# Patient Record
Sex: Male | Born: 1965 | Race: Black or African American | Hispanic: No | Smoking: Former smoker
Health system: Southern US, Community
[De-identification: ages and names within clinical notes are randomized; demographics above are authoritative.]

## PROBLEM LIST (undated history)

## (undated) DIAGNOSIS — F329 Major depressive disorder, single episode, unspecified: Secondary | ICD-10-CM

## (undated) DIAGNOSIS — K649 Unspecified hemorrhoids: Secondary | ICD-10-CM

## (undated) DIAGNOSIS — F419 Anxiety disorder, unspecified: Secondary | ICD-10-CM

## (undated) DIAGNOSIS — K5900 Constipation, unspecified: Secondary | ICD-10-CM

## (undated) DIAGNOSIS — F32A Depression, unspecified: Secondary | ICD-10-CM

## (undated) DIAGNOSIS — T7840XA Allergy, unspecified, initial encounter: Secondary | ICD-10-CM

## (undated) DIAGNOSIS — K602 Anal fissure, unspecified: Secondary | ICD-10-CM

## (undated) HISTORY — DX: Depression, unspecified: F32.A

## (undated) HISTORY — DX: Major depressive disorder, single episode, unspecified: F32.9

## (undated) HISTORY — DX: Unspecified hemorrhoids: K64.9

## (undated) HISTORY — DX: Anal fissure, unspecified: K60.2

## (undated) HISTORY — DX: Constipation, unspecified: K59.00

## (undated) HISTORY — PX: OTHER SURGICAL HISTORY: SHX169

## (undated) HISTORY — DX: Allergy, unspecified, initial encounter: T78.40XA

---

## 2007-12-22 ENCOUNTER — Emergency Department (HOSPITAL_COMMUNITY): Admission: EM | Admit: 2007-12-22 | Discharge: 2007-12-22 | Payer: Self-pay | Admitting: Emergency Medicine

## 2011-09-11 ENCOUNTER — Ambulatory Visit (INDEPENDENT_AMBULATORY_CARE_PROVIDER_SITE_OTHER): Payer: 59

## 2011-09-13 ENCOUNTER — Ambulatory Visit (INDEPENDENT_AMBULATORY_CARE_PROVIDER_SITE_OTHER): Payer: 59 | Admitting: Family Medicine

## 2011-09-13 VITALS — BP 94/64 | HR 65 | Temp 97.6°F | Resp 16 | Ht 67.0 in | Wt 139.0 lb

## 2011-09-13 DIAGNOSIS — F4010 Social phobia, unspecified: Secondary | ICD-10-CM

## 2011-09-13 DIAGNOSIS — F411 Generalized anxiety disorder: Secondary | ICD-10-CM

## 2011-09-13 DIAGNOSIS — F329 Major depressive disorder, single episode, unspecified: Secondary | ICD-10-CM

## 2011-09-13 MED ORDER — CITALOPRAM HYDROBROMIDE 10 MG PO TABS: 10.0000 mg | ORAL_TABLET | Freq: Every day | ORAL | Status: DC

## 2011-09-13 NOTE — Progress Notes (Signed)
  Subjective:    Patient ID: Tyrone Hendricks, male    DOB: 10/03/1961, 46 y.o.   MRN: [id number]  HPI Presents in follow up of depression.   Wellbutrin has helped with improved mood and energy. Complains today of significant social anxiety to the extent he avoids public settings. At times even interacting with co workers can be difficult.  Appetite ok Normal energy Exercising regularly  No SI/HI  Review of Systems     Objective:   Physical Exam  Constitutional: She appears well-developed and well-nourished.  Cardiovascular: Normal rate, regular rhythm and normal heart sounds.   Pulmonary/Chest: Effort normal and breath sounds normal.          Assessment & Plan:   1. Depression   2. Social anxiety disorder    Continue with Wellbutrin Add celexa 10mg  daily Psychotherapy strongly encouraged, names of therapist provided Follow up 1 month

## 2011-09-21 DIAGNOSIS — F329 Major depressive disorder, single episode, unspecified: Secondary | ICD-10-CM | POA: Insufficient documentation

## 2011-09-21 DIAGNOSIS — F32A Depression, unspecified: Secondary | ICD-10-CM | POA: Insufficient documentation

## 2011-10-21 ENCOUNTER — Other Ambulatory Visit: Payer: Self-pay | Admitting: Family Medicine

## 2011-11-09 ENCOUNTER — Other Ambulatory Visit: Payer: Self-pay | Admitting: Family Medicine

## 2011-11-20 ENCOUNTER — Ambulatory Visit (INDEPENDENT_AMBULATORY_CARE_PROVIDER_SITE_OTHER): Payer: 59 | Admitting: Family Medicine

## 2011-11-20 ENCOUNTER — Other Ambulatory Visit: Payer: Self-pay | Admitting: Physician Assistant

## 2011-11-20 VITALS — BP 115/71 | HR 67 | Temp 97.7°F | Resp 18 | Ht 67.0 in | Wt 134.0 lb

## 2011-11-20 DIAGNOSIS — F411 Generalized anxiety disorder: Secondary | ICD-10-CM

## 2011-11-20 DIAGNOSIS — F329 Major depressive disorder, single episode, unspecified: Secondary | ICD-10-CM

## 2011-11-20 MED ORDER — FLUTICASONE PROPIONATE 50 MCG/ACT NA SUSP: 2.0000 | Freq: Every day | NASAL | Status: DC

## 2011-11-20 MED ORDER — BUPROPION HCL ER (XL) 300 MG PO TB24: 300.0000 mg | ORAL_TABLET | Freq: Every day | ORAL | Status: DC

## 2011-11-20 NOTE — Progress Notes (Signed)
  Subjective:    Patient ID: Tyrone Hendricks, male    DOB: 12-19-1965, 46 y.o.   MRN: [id number]  HPI  Patient presents in follow up of anxiety  Compliant with medications without side effects.  Has been seeing psychologist which patient feels has been marginally helpful Feels more confidant with less avoidance behaviours  Significant seasonal allergies- requests refill of flonase  Review of Systems     Objective:   Physical Exam  Constitutional: He appears well-developed.  HENT:  Nose: Mucosal edema present.  Neck: Neck supple.  Cardiovascular: Normal rate, regular rhythm and normal heart sounds.   Pulmonary/Chest: Effort normal and breath sounds normal.  Neurological: He is alert.  Skin: Skin is warm.          Assessment & Plan:   1. Depression  buPROPion (WELLBUTRIN XL) 300 MG 24 hr tablet  2. Allergic rhinitis  fluticasone (FLONASE) 50 MCG/ACT nasal spray   20 minutes of supportive counseling Patient expressed interest in weaning however do not feel it is appropriate at this time.   Patient to RTC in 3 months and if patient continues to do well will reduce dose by 50%

## 2011-11-23 ENCOUNTER — Other Ambulatory Visit: Payer: Self-pay | Admitting: Physician Assistant

## 2012-07-03 ENCOUNTER — Emergency Department (INDEPENDENT_AMBULATORY_CARE_PROVIDER_SITE_OTHER): Payer: Worker's Compensation

## 2012-07-03 ENCOUNTER — Emergency Department (INDEPENDENT_AMBULATORY_CARE_PROVIDER_SITE_OTHER)
Admission: EM | Admit: 2012-07-03 | Discharge: 2012-07-03 | Disposition: A | Discharge status: Home / Self Care | Payer: Worker's Compensation | Source: Home / Self Care | Attending: Family Medicine | Admitting: Family Medicine

## 2012-07-03 ENCOUNTER — Encounter (HOSPITAL_COMMUNITY): Payer: Self-pay | Admitting: Emergency Medicine

## 2012-07-03 DIAGNOSIS — IMO0002 Reserved for concepts with insufficient information to code with codable children: Secondary | ICD-10-CM

## 2012-07-03 HISTORY — DX: Anxiety disorder, unspecified: F41.9

## 2012-07-03 MED ORDER — TRAMADOL HCL 50 MG PO TABS: 50.0000 mg | ORAL_TABLET | Freq: Four times a day (QID) | ORAL | Status: DC | PRN

## 2012-07-03 MED ORDER — IBUPROFEN 600 MG PO TABS: 600.0000 mg | ORAL_TABLET | Freq: Three times a day (TID) | ORAL | Status: DC | PRN

## 2012-07-03 NOTE — ED Notes (Signed)
Left hand pain, injured at work per patient.  Injury occurred one month ago.  Caught in machine.  Left little finger is swollen, deformity present, finger will not straighten

## 2012-07-03 NOTE — ED Provider Notes (Signed)
History     CSN: [id number]  Arrival date & time 07/03/12  4098   First MD Initiated Contact with Patient 07/03/12 1754      Chief Complaint  Patient presents with  . Hand Pain    (Consider location/radiation/quality/duration/timing/severity/associated sxs/prior treatment) HPI Comments: 46 year old male with history of mood disorder. Works in a Adult nurse. Here complaining of left hand fifth finger pain, swelling and deformity for one month. Patient reports that he had an injury where his left small finger got caught by a machine belt and he pulled his hand about a month ago. He had swelling and pain after his injury that is not resolving, cannot strengthen his finger completely. 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 Denies any other symptoms.    Past Medical History  Diagnosis Date  . Anxiety     History reviewed. No pertinent past surgical history.  No family history on file.  History  Substance Use Topics  . Smoking status: Former Smoker    Quit date: 11/20/2006  . Smokeless tobacco: Not on file  . Alcohol Use: No      Review of Systems  Musculoskeletal:       As per HPI  Skin: Negative for wound.  All other systems reviewed and are negative.    Allergies  Review of patient's allergies indicates no known allergies.  Home Medications   Current Outpatient Rx  Name  Route  Sig  Dispense  Refill  . BUPROPION HCL ER (XL) 300 MG PO TB24   Oral   Take 300 mg by mouth daily.         . BUPROPION HCL ER (XL) 300 MG PO TB24   Oral   Take 1 tablet (300 mg total) by mouth daily.   90 tablet   2     Needs office visit for more   . BUPROPION HCL ER (XL) 300 MG PO TB24      TAKE 1 TABLET BY MOUTH DAILY   30 tablet   0   . CITALOPRAM HYDROBROMIDE 10 MG PO TABS   Oral   Take 1 tablet (10 mg total) by mouth daily.   30 tablet   1   . FLUTICASONE PROPIONATE 50  MCG/ACT NA SUSP   Nasal   Place 2 sprays into the nose daily.   16 g   11   . IBUPROFEN 600 MG PO TABS   Oral   Take 1 tablet (600 mg total) by mouth every 8 (eight) hours as needed for pain.   20 tablet   0   . BACID PO TABS   Oral   Take 2 tablets by mouth daily.         . TRAMADOL HCL 50 MG PO TABS   Oral   Take 1 tablet (50 mg total) by mouth every 6 (six) hours as needed for pain.   15 tablet   0     BP 113/80  Pulse 64  Temp 97.7 F (36.5 C) (Oral)  Resp 18  SpO2 98%  Physical Exam  Nursing note and vitals reviewed. Constitutional: He is oriented to person, place, and time. He appears well-developed and well-nourished. No distress.  HENT:  Head: Normocephalic and atraumatic.  Cardiovascular: Normal heart sounds.   Pulmonary/Chest: Breath sounds normal.  Musculoskeletal:       Left 5th finger: swelling and deformity at  the expense of middle phalange. No wounds or scars. Focal tenderness at mid phalange both dorsally and on palmar side. Patient able to partially extend finger. Can not extend to straight position the distal phalange. Intact sensation. Brisk cap refill at digital pad and entire 5th digit. Rest of let hand exam is normal.   Neurological: He is alert and oriented to person, place, and time.    ED Course  Procedures (including critical care time)  Labs Reviewed - No data to display Dg Finger Little Left  07/03/2012  *RADIOLOGY REPORT*  Clinical Data: Pain, swelling  LEFT LITTLE FINGER 2+V  Comparison: None.  Findings: There is a transverse minimally comminuted fracture of the middle phalanx left little finger across the proximal base, without definite extension to the subchondral cortex.  There is 1-2 mm palmar displacement of the distal fracture fragment with some mild dorsal angulation.  There is some early callus formation at the dorsal aspect of the fracture.  IMPRESSION:  1.  Healing transverse fracture, base middle phalanx left little finger,  with mild displacement and angulation.   Original Report Authenticated By: D. Andria Rhein, MD      1. Closed fracture of middle or proximal phalanx or phalanges of hand       MDM  Complex angulated fracture at the base of the mid phalanx of the left fifth finger. Possible involvement of extensor tendon as well. One month evolution. Placed on a finger splint. Prescribed ibuprofen and tramadol. Asked to followup with the hand specialist. Referral provided. Asked to followup at the works man comp clinic designated by his employer.  Sharin Grave, MD 07/05/12 0945

## 2012-07-03 NOTE — ED Notes (Signed)
Tyrone Hendricks called [facility].  Clarified the intent of visit is to address finger complaint.  No labs to be done since injury one month out.

## 2012-07-03 NOTE — ED Notes (Signed)
Went to [facility] today at 4:30, told they were closing and sent to [facility].

## 2012-07-13 ENCOUNTER — Ambulatory Visit (INDEPENDENT_AMBULATORY_CARE_PROVIDER_SITE_OTHER): Payer: 59 | Admitting: Physician Assistant

## 2012-07-13 VITALS — BP 98/66 | HR 64 | Temp 97.7°F | Resp 16 | Ht 67.5 in | Wt 148.2 lb

## 2012-07-13 DIAGNOSIS — K649 Unspecified hemorrhoids: Secondary | ICD-10-CM

## 2012-07-13 DIAGNOSIS — K5909 Other constipation: Secondary | ICD-10-CM | POA: Insufficient documentation

## 2012-07-13 DIAGNOSIS — K5900 Constipation, unspecified: Secondary | ICD-10-CM

## 2012-07-13 DIAGNOSIS — F329 Major depressive disorder, single episode, unspecified: Secondary | ICD-10-CM

## 2012-07-13 MED ORDER — POLYETHYLENE GLYCOL 3350 17 GM/SCOOP PO POWD: 17.0000 g | Freq: Every day | ORAL | Status: DC

## 2012-07-13 MED ORDER — BUPROPION HCL ER (XL) 150 MG PO TB24: 300.0000 mg | ORAL_TABLET | Freq: Every day | ORAL | Status: DC

## 2012-07-13 MED ORDER — HYDROCORTISONE 2.5 % RE CREA: TOPICAL_CREAM | Freq: Two times a day (BID) | RECTAL | Status: DC

## 2012-07-13 NOTE — Patient Instructions (Addendum)
Take miralax to help with constipation.  Increase fluid intake and start a fiber supplement. COntinue good amounts of fruits and veggies. Will send to GI for eval of chronic constipation.  Will decrease wellbutrin for depression - pt to recheck in 1 month to see how he is doing.

## 2012-07-13 NOTE — Progress Notes (Signed)
   [address] [place] [address]   Phone [phone]  Subjective:    Patient ID: Tyrone Hendricks, male    DOB: [birth year], 46 y.o.   MRN: [id number]  HPI Pt presents to clinic with concerns regarding his depression and constipation.  He had a significant problem with constipation in 2010 and was put on medication for depression that resulted from that.  He has wanted to go off the medication for a while.  Early this year he felt much better but at the time he was still having social anxiety so they kept him on the medication.  Now his anxiety is better and he feels no depression and would like to go off the medication.  He is still having constipation with hard and large stools that are painful to pass.  He has tried to increase his fruits and veggies but it does not seem to make a difference.  He as resulting hemorrhoids that he used to have cream for but he has run out and has flairs that are really uncomfortable.  He has blood a lot on toilet tissue and sometimes into the water.    Review of Systems  Gastrointestinal: Positive for constipation and blood in stool. Negative for diarrhea.  Psychiatric/Behavioral: Negative for suicidal ideas, sleep disturbance, self-injury and dysphoric mood. The patient is nervous/anxious (much better).        Objective:   Physical Exam  Vitals reviewed. Constitutional: He is oriented to person, place, and time. He appears well-developed and well-nourished.  HENT:  Head: Normocephalic and atraumatic.  Right Ear: External ear normal.  Left Ear: External ear normal.  Cardiovascular: Normal rate, regular rhythm and normal heart sounds.   No murmur heard. Pulmonary/Chest: Effort normal and breath sounds normal.  Abdominal: Soft.  Genitourinary: Rectal exam shows external hemorrhoid. Rectal exam shows no fissure, no mass and no tenderness. Guaiac negative stool.  Neurological: He is alert and oriented to person, place, and time.  Skin: Skin is warm and dry.   Psychiatric: His behavior is normal. Judgment and thought content normal. His affect is blunt.          Assessment & Plan:   1. Constipation, chronic  TSH, IFOBT POC (occult bld, rslt in office), polyethylene glycol powder (GLYCOLAX/MIRALAX) powder, Ambulatory referral to Gastroenterology  2. Hemorrhoids  hydrocortisone (ANUSOL-HC) 2.5 % rectal cream  3. Depression  buPROPion (WELLBUTRIN XL) 150 MG 24 hr tablet   1- concerning that before 2010 he had no problems with constipation and now he has chronic constipation.  I think a referral is indicated to GI. He is increase water, fiber with fruits and veggies.  Will check to make sure patient thyroid is functioning normal. 2- pt to try and control constipation to decrease problems with hemorrhoids. 3- I am concerned that pt might still have some depression but he really wants to get off these meds so we will decrease wellbutrin and recheck in 1 month.  Pt understands and agrees with the above.

## 2012-07-14 ENCOUNTER — Encounter: Payer: Self-pay | Admitting: Gastroenterology

## 2012-07-17 ENCOUNTER — Telehealth: Payer: Self-pay

## 2012-07-17 NOTE — Telephone Encounter (Signed)
Sarah, in OV notes it looks like you wanted pt to decrease his Wellbutrin, but Rx is written for 150 mg 2 tablets QD. Did you want him to taper down?

## 2012-07-17 NOTE — Telephone Encounter (Signed)
Tyrone Hendricks,    Patient wants to know if he is to reduce the medication Wellbutrin.  He is currently taking 300 mg a day.  [phone]

## 2012-07-18 NOTE — Telephone Encounter (Signed)
Yes he should take 1 a day but I wanted to have the ability for him to take 2 if he needed to.

## 2012-07-19 NOTE — Telephone Encounter (Signed)
LMOM to Cb to discuss.

## 2012-07-21 NOTE — Telephone Encounter (Signed)
lmom to cb. 

## 2012-07-22 NOTE — Telephone Encounter (Signed)
Pt CB and I gave him instr's from Maralyn Sago. Pt thought this was what he was supposed to do, but just wasn't completely sure. Pt ageed.

## 2012-07-26 ENCOUNTER — Other Ambulatory Visit: Payer: Self-pay | Admitting: Physician Assistant

## 2012-07-30 ENCOUNTER — Ambulatory Visit: Payer: Self-pay | Admitting: Gastroenterology

## 2012-08-04 ENCOUNTER — Encounter: Payer: Self-pay | Admitting: *Deleted

## 2012-08-13 ENCOUNTER — Ambulatory Visit: Payer: Self-pay | Admitting: Gastroenterology

## 2012-09-01 ENCOUNTER — Ambulatory Visit: Payer: Self-pay | Admitting: Gastroenterology

## 2012-09-03 ENCOUNTER — Ambulatory Visit (INDEPENDENT_AMBULATORY_CARE_PROVIDER_SITE_OTHER): Payer: PRIVATE HEALTH INSURANCE | Admitting: Gastroenterology

## 2012-09-03 ENCOUNTER — Encounter: Payer: Self-pay | Admitting: Gastroenterology

## 2012-09-03 VITALS — BP 98/58 | HR 60 | Ht 67.5 in | Wt 146.8 lb

## 2012-09-03 DIAGNOSIS — K5909 Other constipation: Secondary | ICD-10-CM

## 2012-09-03 DIAGNOSIS — K625 Hemorrhage of anus and rectum: Secondary | ICD-10-CM

## 2012-09-03 DIAGNOSIS — K602 Anal fissure, unspecified: Secondary | ICD-10-CM

## 2012-09-03 DIAGNOSIS — K5900 Constipation, unspecified: Secondary | ICD-10-CM

## 2012-09-03 MED ORDER — MOVIPREP 100 G PO SOLR: 1.0000 | Freq: Once | ORAL | Status: DC

## 2012-09-03 MED ORDER — NITROGLYCERIN 0.4 % RE OINT: 1.0000 "application " | TOPICAL_OINTMENT | Freq: Two times a day (BID) | RECTAL | Status: DC

## 2012-09-03 NOTE — Patient Instructions (Signed)
You have been scheduled for a colonoscopy with propofol. Please follow written instructions given to you at your visit today.  Please pick up your prep kit at the pharmacy within the next 1-3 days. If you use inhalers (even only as needed) or a CPAP machine, please bring them with you on the day of your procedure.  We have sent the following medications to your pharmacy for you to pick up at your convenience: Rectiv. Please apply to rectal area twice daily.  Please purchase Metamucil over the counter. Take as directed.  Information on High Fiber Diet and Anal Fissures is listed below.  ____________________________________________________________________________________________________________________  High-Fiber Diet Fiber is found in fruits, vegetables, and grains. A high-fiber diet encourages the addition of more whole grains, legumes, fruits, and vegetables in your diet. The recommended amount of fiber for adult males is 38 g per day. For adult females, it is 25 g per day. Pregnant and lactating women should get 28 g of fiber per day. If you have a digestive or bowel problem, ask your caregiver for advice before adding high-fiber foods to your diet. Eat a variety of high-fiber foods instead of only a select few type of foods.  PURPOSE  To increase stool bulk.  To make bowel movements more regular to prevent constipation.  To lower cholesterol.  To prevent overeating. WHEN IS THIS DIET USED?  It may be used if you have constipation and hemorrhoids.  It may be used if you have uncomplicated diverticulosis (intestine condition) and irritable bowel syndrome.  It may be used if you need help with weight management.  It may be used if you want to add it to your diet as a protective measure against atherosclerosis, diabetes, and cancer. SOURCES OF FIBER  Whole-grain breads and cereals.  Fruits, such as apples, oranges, bananas, berries, prunes, and pears.  Vegetables, such as green  peas, carrots, sweet potatoes, beets, broccoli, cabbage, spinach, and artichokes.  Legumes, such split peas, soy, lentils.  Almonds. FIBER CONTENT IN FOODS Starches and Grains / Dietary Fiber (g)  Cheerios, 1 cup / 3 g  Corn Flakes cereal, 1 cup / 0.7 g  Rice crispy treat cereal, 1 cup / 0.3 g  Instant oatmeal (cooked),  cup / 2 g  Frosted wheat cereal, 1 cup / 5.1 g  Brown, long-grain rice (cooked), 1 cup / 3.5 g  White, long-grain rice (cooked), 1 cup / 0.6 g  Enriched macaroni (cooked), 1 cup / 2.5 g Legumes / Dietary Fiber (g)  Baked beans (canned, plain, or vegetarian),  cup / 5.2 g  Kidney beans (canned),  cup / 6.8 g  Pinto beans (cooked),  cup / 5.5 g Breads and Crackers / Dietary Fiber (g)  Plain or honey graham crackers, 2 squares / 0.7 g  Saltine crackers, 3 squares / 0.3 g  Plain, salted pretzels, 10 pieces / 1.8 g  Whole-wheat bread, 1 slice / 1.9 g  White bread, 1 slice / 0.7 g  Raisin bread, 1 slice / 1.2 g  Plain bagel, 3 oz / 2 g  Flour tortilla, 1 oz / 0.9 g  Corn tortilla, 1 small / 1.5 g  Hamburger or hotdog bun, 1 small / 0.9 g Fruits / Dietary Fiber (g)  Apple with skin, 1 medium / 4.4 g  Sweetened applesauce,  cup / 1.5 g  Banana,  medium / 1.5 g  Grapes, 10 grapes / 0.4 g  Orange, 1 small / 2.3 g  Raisin, 1.5 oz /  1.6 g  Melon, 1 cup / 1.4 g Vegetables / Dietary Fiber (g)  Green beans (canned),  cup / 1.3 g  Carrots (cooked),  cup / 2.3 g  Broccoli (cooked),  cup / 2.8 g  Peas (cooked),  cup / 4.4 g  Mashed potatoes,  cup / 1.6 g  Lettuce, 1 cup / 0.5 g  Corn (canned),  cup / 1.6 g  Tomato,  cup / 1.1 g Document Released: 07/29/2005 Document Revised: 01/28/2012 Document Reviewed: 10/31/2011 [employer] Patient Information 2013 [employer], [place]. _________________________________________________________________________________________________________________  Tyrone Hendricks, Adult An anal fissure is  a small tear or crack in the skin around the anus. Bleeding from a fissure usually stops on its own within a few minutes. However, bleeding will often reoccur with each bowel movement until the crack heals.  CAUSES   Passing large, hard stools.  Frequent diarrheal stools.  Constipation.  Inflammatory bowel disease (Crohn's disease or ulcerative colitis).  Infections.  Anal sex. SYMPTOMS   Small amounts of blood seen on your stools, on toilet paper, or in the toilet after a bowel movement.  Rectal bleeding.  Painful bowel movements.  Itching or irritation around the anus. DIAGNOSIS Your caregiver will examine the anal area. An anal fissure can usually be seen with careful inspection. A rectal exam may be performed and a short tube (anoscope) may be used to examine the anal canal. TREATMENT   You may be instructed to take fiber supplements. These supplements can soften your stool to help make bowel movements easier.  Sitz baths may be recommended to help heal the tear. Do not use soap in the sitz baths.  A medicated cream or ointment may be prescribed to lessen discomfort. HOME CARE INSTRUCTIONS   Maintain a diet high in fruits, whole grains, and vegetables. Avoid constipating foods like bananas and dairy products.  Take sitz baths as directed by your caregiver.  Drink enough fluids to keep your urine clear or pale yellow.  Only take over-the-counter or prescription medicines for pain, discomfort, or fever as directed by your caregiver. Do not take aspirin as this may increase bleeding.  Do not use ointments containing numbing medications (anesthetics) or hydrocortisone. They could slow healing. SEEK MEDICAL CARE IF:   Your fissure is not completely healed within 3 days.  You have further bleeding.  You have a fever.  You have diarrhea mixed with blood.  You have pain.  Your problem is getting worse rather than better. MAKE SURE YOU:   Understand these  instructions.  Will watch your condition.  Will get help right away if you are not doing well or get worse. Document Released: 07/29/2005 Document Revised: 10/21/2011 Document Reviewed: 01/13/2011 [facility] Patient Information 2013 [place], [place].

## 2012-09-03 NOTE — Progress Notes (Signed)
History of Present Illness:  This is a 47 year old African male who is been in the [place] since 2001.  He has a long history of chronic functional constipation, has had bright red blood per rectum and rectal pain on and off since 2009.  He has not had previous barium studies or colon exams.  He continues with rectal bleeding and rectal pain despite salves supplied by is primary care physician.  He denies a specific food intolerances, upper GI or hepatobiliary complaints.  Family history is incomplete.  I have reviewed this patient's present history, medical and surgical past history, allergies and medications.     ROS:   All systems were reviewed and are negative unless otherwise stated in the HPI.    Physical Exam: Blood pressure 90/58, pulse 60 and regular, and weight 146 pounds the BMI 22.65. General well developed well nourished patient in no acute distress, appearing their stated age Eyes PERRLA, no icterus, fundoscopic exam per opthamologist Skin no lesions noted Neck supple, no adenopathy, no thyroid enlargement, no tenderness Chest clear to percussion and auscultation Heart no significant murmurs, gallops or rubs noted Abdomen no hepatosplenomegaly masses or tenderness, BS normal.  Rectal inspection normal no fissures, or fistulae noted.  No masses or tenderness on digital exam. Stool guaiac negative. Extremities no acute joint lesions, edema, phlebitis or evidence of cellulitis. Neurologic patient oriented x 3, cranial nerves intact, no focal neurologic deficits noted. Psychological mental status normal and normal affect.  ANOSCOPY: Medium-sized posterior skin tag noted.  Rectal exam shows no masses or tenderness with hard stool in the rectal vault which is guaiac negative.  Anoscopic exam shows a prominent posterior chronic fissure with some minor bleeding.  There is no evidence of significant hemorrhoids, or fistulae.  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  I have placed him on a high fiber diet with daily Metamucil, liberal by mouth fluids, and nitroglycerin gel twice a day to his periana/rectal area.  We'll perform colonoscopy one month's time.  Information given concerning fissures, constipation, and their management.  No diagnosis found.

## 2012-09-14 ENCOUNTER — Encounter: Payer: Self-pay | Admitting: Gastroenterology

## 2012-09-16 ENCOUNTER — Ambulatory Visit (AMBULATORY_SURGERY_CENTER): Payer: PRIVATE HEALTH INSURANCE | Admitting: Gastroenterology

## 2012-09-16 ENCOUNTER — Encounter: Payer: Self-pay | Admitting: Gastroenterology

## 2012-09-16 VITALS — BP 103/73 | HR 55 | Temp 96.5°F | Resp 15 | Ht 67.0 in | Wt 146.0 lb

## 2012-09-16 DIAGNOSIS — K644 Residual hemorrhoidal skin tags: Secondary | ICD-10-CM

## 2012-09-16 DIAGNOSIS — K5909 Other constipation: Secondary | ICD-10-CM

## 2012-09-16 DIAGNOSIS — K5900 Constipation, unspecified: Secondary | ICD-10-CM

## 2012-09-16 DIAGNOSIS — Q433 Congenital malformations of intestinal fixation: Secondary | ICD-10-CM

## 2012-09-16 DIAGNOSIS — Z1211 Encounter for screening for malignant neoplasm of colon: Secondary | ICD-10-CM

## 2012-09-16 DIAGNOSIS — K602 Anal fissure, unspecified: Secondary | ICD-10-CM

## 2012-09-16 DIAGNOSIS — K625 Hemorrhage of anus and rectum: Secondary | ICD-10-CM

## 2012-09-16 MED ORDER — SODIUM CHLORIDE 0.9 % IV SOLN: 500.0000 mL | INTRAVENOUS | Status: DC

## 2012-09-16 NOTE — Patient Instructions (Addendum)
YOU HAD AN ENDOSCOPIC PROCEDURE TODAY AT THE [facility]: Refer to the procedure report that was given to you for any specific questions about what was found during the examination.  If the procedure report does not answer your questions, please call your gastroenterologist to clarify.  If you requested that your care partner not be given the details of your procedure findings, then the procedure report has been included in a sealed envelope for you to review at your convenience later.  YOU SHOULD EXPECT: Some feelings of bloating in the abdomen. Passage of more gas than usual.  Walking can help get rid of the air that was put into your GI tract during the procedure and reduce the bloating. If you had a lower endoscopy (such as a colonoscopy or flexible sigmoidoscopy) you may notice spotting of blood in your stool or on the toilet paper. If you underwent a bowel prep for your procedure, then you may not have a normal bowel movement for a few days.  DIET: Your first meal following the procedure should be a light meal and then it is ok to progress to your normal diet.  A half-sandwich or bowl of soup is an example of a good first meal.  Heavy or fried foods are harder to digest and may make you feel nauseous or bloated.  Likewise meals heavy in dairy and vegetables can cause extra gas to form and this can also increase the bloating.  Drink plenty of fluids but you should avoid alcoholic beverages for 24 hours.  ACTIVITY: Your care partner should take you home directly after the procedure.  You should plan to take it easy, moving slowly for the rest of the day.  You can resume normal activity the day after the procedure however you should NOT DRIVE or use heavy machinery for 24 hours (because of the sedation medicines used during the test).    SYMPTOMS TO REPORT IMMEDIATELY: A gastroenterologist can be reached at any hour.  During normal business hours, 8:30 AM to 5:00 PM Monday through Friday,  call [phone].  After hours and on weekends, please call the GI answering service at [phone] who will take a message and have the physician on call contact you.   Following lower endoscopy (colonoscopy or flexible sigmoidoscopy):  Excessive amounts of blood in the stool  Significant tenderness or worsening of abdominal pains  Swelling of the abdomen that is new, acute  Fever of 100F or higher    FOLLOW UP: If any biopsies were taken you will be contacted by phone or by letter within the next 1-3 weeks.  Call your gastroenterologist if you have not heard about the biopsies in 3 weeks.  Our staff will call the home number listed on your records the next business day following your procedure to check on you and address any questions or concerns that you may have at that time regarding the information given to you following your procedure. This is a courtesy call and so if there is no answer at the home number and we have not heard from you through the emergency physician on call, we will assume that you have returned to your regular daily activities without incident.  SIGNATURES/CONFIDENTIALITY: You and/or your care partner have signed paperwork which will be entered into your electronic medical record.  These signatures attest to the fact that that the information above on your After Visit Summary has been reviewed and is understood.  Full responsibility of the confidentiality   of this discharge information lies with you and/or your care-partner.     

## 2012-09-16 NOTE — Op Note (Signed)
[facility] [address] [place], [address]   COLONOSCOPY PROCEDURE REPORT  PATIENT: Tyrone Hendricks, Tyrone Hendricks  MR#: [id number] BIRTHDATE: 05/02/1966 , 46  yrs. old GENDER: Male ENDOSCOPIST: Mardella Layman, MD, [facility] REFERRED BY: PROCEDURE DATE:  09/16/2012 PROCEDURE:   Colonoscopy, diagnostic ASA CLASS:   Class I INDICATIONS:Rectal Bleeding and Average risk patient for colon cancer. MEDICATIONS: propofol (Diprivan) 400mg  IV  DESCRIPTION OF PROCEDURE:   After the risks and benefits and of the procedure were explained, informed consent was obtained.  A digital rectal exam revealed external hemorrhoids.    The     endoscope was introduced through the anus and advanced to the cecum, which was identified by both the appendix and ileocecal valve .  The quality of the prep was good, using MoviPrep .  The instrument was then slowly withdrawn as the colon was fully examined.     COLON FINDINGS: The colon was redundant.  Manual abdominal counter-pressure was used to reach the cecum.   A normal appearing cecum, ileocecal valve, and appendiceal orifice were identified. The ascending, hepatic flexure, transverse, splenic flexure, descending, sigmoid colon and rectum appeared unremarkable.  No polyps or cancers were seen. 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  This is extremely difficult and long exam because of the tortuosity and redundancy and probable anatomical abnormality of his colon.    Retroflexed views revealed no abnormalities.     The scope was then withdrawn from the patient and the procedure completed.  COMPLICATIONS: There were no complications. ENDOSCOPIC IMPRESSION: 1.   The colon was redundant .Marland KitchenMarland Kitchenprobable malrotation of colon... 2.   Normal colon...external hemorrhoids,fissure appears healed  RECOMMENDATIONS: Continue current medications..may need barium enema for better anatomical clarification,contine meds for  constipation !!!!   REPEAT EXAM: .  repeat exam every 10 years...  cc:  _______________________________ eSigned:  Mardella Layman, MD, [facility] 09/16/2012 2:59 PM     PATIENT NAME:  Tyrone Hendricks, Tyrone Hendricks MR#: [id number]

## 2012-09-16 NOTE — Progress Notes (Signed)
Patient did not experience any of the following events: a burn prior to discharge; a fall within the facility; wrong site/side/patient/procedure/implant event; or a hospital transfer or hospital admission upon discharge from the facility. (G8907) Patient did not have preoperative order for IV antibiotic SSI prophylaxis. (G8918)  

## 2012-09-17 ENCOUNTER — Telehealth: Payer: Self-pay

## 2012-09-17 NOTE — Telephone Encounter (Signed)
Left message on number given in admitting for call-back purposes.

## 2012-09-26 ENCOUNTER — Other Ambulatory Visit: Payer: Self-pay

## 2012-11-23 ENCOUNTER — Telehealth: Payer: Self-pay | Admitting: Radiology

## 2012-11-23 MED ORDER — MOMETASONE FUROATE 50 MCG/ACT NA SUSP: 2.0000 | Freq: Every day | NASAL | Status: DC

## 2012-11-23 NOTE — Telephone Encounter (Signed)
Patient wants refill on Nasonex. Please advise

## 2012-11-23 NOTE — Telephone Encounter (Signed)
Rx sent to pharmacy   

## 2012-12-11 ENCOUNTER — Telehealth: Payer: Self-pay

## 2012-12-11 ENCOUNTER — Ambulatory Visit (INDEPENDENT_AMBULATORY_CARE_PROVIDER_SITE_OTHER): Payer: No Typology Code available for payment source | Admitting: Emergency Medicine

## 2012-12-11 VITALS — BP 103/66 | HR 60 | Temp 97.6°F | Resp 16 | Ht 69.0 in | Wt 151.4 lb

## 2012-12-11 DIAGNOSIS — J309 Allergic rhinitis, unspecified: Secondary | ICD-10-CM

## 2012-12-11 MED ORDER — ALBUTEROL SULFATE HFA 108 (90 BASE) MCG/ACT IN AERS: 2.0000 | INHALATION_SPRAY | RESPIRATORY_TRACT | Status: DC | PRN

## 2012-12-11 MED ORDER — FLUTICASONE PROPIONATE 50 MCG/ACT NA SUSP: 2.0000 | Freq: Every day | NASAL | Status: DC

## 2012-12-11 NOTE — Telephone Encounter (Signed)
Pt states his nasonex doesn't work. He says its affects his breathing. Asks for something else.    Pharmacy: [facility] [address]

## 2012-12-11 NOTE — Telephone Encounter (Signed)
If he is having trouble with his breathing, he should come in for this.

## 2012-12-11 NOTE — Patient Instructions (Addendum)
Allergic Rhinitis Allergic rhinitis is when the mucous membranes in the nose respond to allergens. Allergens are particles in the air that cause your body to have an allergic reaction. This causes you to release allergic antibodies. Through a chain of events, these eventually cause you to release histamine into the blood stream (hence the use of antihistamines). Although meant to be protective to the body, it is this release that causes your discomfort, such as frequent sneezing, congestion and an itchy runny nose.  CAUSES  The pollen allergens may come from grasses, trees, and weeds. This is seasonal allergic rhinitis, or "hay fever." Other allergens cause year-round allergic rhinitis (perennial allergic rhinitis) such as house dust mite allergen, pet dander and mold spores.  SYMPTOMS   Nasal stuffiness (congestion).  Runny, itchy nose with sneezing and tearing of the eyes.  There is often an itching of the mouth, eyes and ears. It cannot be cured, but it can be controlled with medications. DIAGNOSIS  If you are unable to determine the offending allergen, skin or blood testing may find it. TREATMENT   Avoid the allergen.  Medications and allergy shots (immunotherapy) can help.  Hay fever may often be treated with antihistamines in pill or nasal spray forms. Antihistamines block the effects of histamine. There are over-the-counter medicines that may help with nasal congestion and swelling around the eyes. Check with your caregiver before taking or giving this medicine. If the treatment above does not work, there are many new medications your caregiver can prescribe. Stronger medications may be used if initial measures are ineffective. Desensitizing injections can be used if medications and avoidance fails. Desensitization is when a patient is given ongoing shots until the body becomes less sensitive to the allergen. Make sure you follow up with your caregiver if problems continue. SEEK MEDICAL  CARE IF:   You develop fever (more than 100.5 F (38.1 C).  You develop a cough that does not stop easily (persistent).  You have shortness of breath.  You start wheezing.  Symptoms interfere with normal daily activities. Document Released: 04/23/2001 Document Revised: 10/21/2011 Document Reviewed: 11/02/2008 [facility] Patient Information 2013 [place], [place]. Metered Dose Inhaler (No Spacer Used) Inhaled medicines are the basis of asthma treatment and other breathing problems. Inhaled medicine can only be effective if used properly. Good technique assures that the medicine reaches the lungs. Metered dose inhalers (MDIs) are used to deliver a variety of inhaled medicines. These include quick relief medicines, controller medicines (such as corticosteroids), and cromolyn. The medicine is delivered by pushing down on a metal canister to release a set amount of spray.  If you are using different kinds of inhalers, use your quick relief medicine to open the airways 10 to 15 minutes before using a steroid. If you are unsure which inhalers to use and the order of using them, ask your caregiver, nurse, or respiratory therapist. HOW TO USE THE INHALER 1. Remove cap from inhaler. 2. Shake inhaler for 5 seconds before each inhalation (breathing in). 3. Position the inhaler so that the top of the canister faces up. 4. Put your index finger on the top of the medication canister. Your thumb supports the bottom of the inhaler. 5. Open your mouth. 6. Hold the inhaler 1 to 2 inches away from your open mouth. This allows the medicine to slow down before the medicine enters the mouth. 7. Exhale (breathe out) normally and as completely as possible. 8. Press the canister down with the index finger to release the  medication. 9. At the same time as the canister is pressed, inhale deeply and slowly until the lungs are completely filled. This should take 4 to 6 seconds. Keep your tongue down. 10. Hold the  medication in your lungs for up to 10 seconds (10 seconds is best). This helps the medicine get into the small airways of your lungs to work better. 11. Breathe out slowly, through pursed lips. Whistling is an example of pursed lips. 12. Wait at least 1 minute between puffs. Continue with the above steps until you have taken the number of puffs your caregiver has ordered. 13. Replace cap on inhaler. AVOID:  Inhaling before or after starting the spray of medicine. It takes practice to coordinate your breathing with triggering the spray.  Inhaling through the nose (rather than the mouth) when triggering the spray. HOW TO DETERMINE IF YOUR INHALER IS FULL OR NEARLY EMPTY:  Determine when an inhaler is empty. You cannot know when an MDI canister is empty by shaking it. A few MDIs are now being made with dose counters. Ask your caregiver for a prescription that has a dose counter if you feel you need that extra help.  If your inhaler does not have a counter, check the number of doses in the inhaler before you use it. The canister or box will list the number of doses in the canister. Divide the total number of doses in the canister by the number you will use each day to find how many days the canister will last. (For example, if your canister has 200 doses and you take 2 puffs, 4 times each day, which is 8 puffs a day. Dividing 200 by 8 equals 25. The canister should last 25 days.) Using a calendar, count forward that many days to see when your inhaler will run out. Write the refill date on a calendar or your canister.  Remember, if you need to take extra doses, the inhaler will empty sooner than you figured. Be sure you have a refill before your canister runs out. Refill your inhaler 7 to 10 days before it runs out. HOME CARE INSTRUCTIONS   Do not use the inhaler more than your caregiver tells you. If you are still wheezing and are feeling tightness in your chest, call your caregiver.  Keep an  adequate supply of medication. This includes making sure the medicine is not expired, and you have a spare MDI.  Follow your caregiver or inhaler insert directions for cleaning the inhaler. SEEK MEDICAL CARE IF:   Symptoms are only partially relieved with your inhalers.  You are having trouble using your inhalers.  You experience some increase in phlegm.  You develop a fever of 102 F (38.9 C). SEEK IMMEDIATE MEDICAL CARE IF:   You feel little or no relief with your inhalers. You are still wheezing and are feeling shortness of breath and/or tightness in your chest.  You have side effects such as dizziness, headaches, or fast heart rate.  You have chills, fever, night sweats or an oral temperature above 102 F (38.9 C) develops.  Phlegm production increases a lot, or there is blood in the phlegm. MAKE SURE YOU:   Understand these instructions.  Will watch your condition.  Will get help right away if you are not doing well or get worse. Document Released: 05/26/2007 Document Revised: 10/21/2011 Document Reviewed: 05/16/2009 [facility] Patient Information 2013 [place], [place].

## 2012-12-11 NOTE — Progress Notes (Signed)
[facility] [address] [place] [address] [phone]  Date:  12/11/2012   Name:  Tyrone Hendricks   DOB:  Aug 11, 1966   MRN:  [id number]  PCP:  No primary provider on file.    Chief Complaint: Allergies and Depression   History of Present Illness:  Tyrone Hendricks is a 47 y.o. very pleasant male patient who presents with the following:  Switched from flonase to nasonex and has not received the same benefit with the new medication.  Not using an antihistamine.  No fever or chills.  Has watery nasal drainage and itchy watery eyes since allergy season began this year.  Says he is off the antidepressant and not affected by depression currently.  No cough or wheezing or shortness of breath.  No improvement with over the counter medications or other home remedies. Denies other complaint or health concern today.   Patient Active Problem List   Diagnosis Date Noted  . Constipation, chronic 07/13/2012  . Depression 09/21/2011    Past Medical History  Diagnosis Date  . Anxiety   . Depression   . Unspecified constipation   . Hemorrhoids   . Allergy     Past Surgical History  Procedure Laterality Date  . Unremarkable      History  Substance Use Topics  . Smoking status: Former Smoker    Quit date: 11/20/2006  . Smokeless tobacco: Never Used  . Alcohol Use: No    Family History  Problem Relation Age of Onset  . Colon cancer Neg Hx   . Colon polyps Neg Hx   . Stomach cancer Father     No Known Allergies  Medication list has been reviewed and updated.  Current Outpatient Prescriptions on File Prior to Visit  Medication Sig Dispense Refill  . buPROPion (WELLBUTRIN XL) 150 MG 24 hr tablet Take 2 tablets (300 mg total) by mouth daily.  30 tablet  0  . ibuprofen (ADVIL,MOTRIN) 600 MG tablet Take 1 tablet (600 mg total) by mouth every 8 (eight) hours as needed for pain.  20 tablet  0  . mometasone (NASONEX) 50 MCG/ACT nasal spray Place 2 sprays into the nose  daily.  17 g  12  . Nitroglycerin (RECTIV) 0.4 % OINT Place 1 application rectally 2 (two) times daily.  30 g  0   No current facility-administered medications on file prior to visit.    Review of Systems:  As per HPI, otherwise negative.    Physical Examination: Filed Vitals:   12/11/12 1344  BP: 103/66  Pulse: 60  Temp: 97.6 F (36.4 C)  Resp: 16   Filed Vitals:   12/11/12 1344  Height: 5\' 9"  (1.753 m)  Weight: 151 lb 6.4 oz (68.675 kg)   Body mass index is 22.35 kg/(m^2). Ideal Body Weight: Weight in (lb) to have BMI = 25: 168.9  GEN: WDWN, NAD, Non-toxic, A & O x 3 HEENT: Atraumatic, Normocephalic. Neck supple. No masses, No LAD. Ears and Nose: No external deformity.  Pallid swollen nasal mucosa CV: RRR, No M/G/R. No JVD. No thrill. No extra heart sounds. PULM: CTA B, scattered minimal wheezes, no crackles, rhonchi. No retractions. No resp. distress. No accessory muscle use. ABD: S, NT, ND, +BS. No rebound. No HSM. EXTR: No c/c/e NEURO Normal gait.  PSYCH: Normally interactive. Conversant. Not depressed or anxious appearing.  Calm demeanor.    Assessment and Plan: Seasonal allergic rhinitis flonase Claritin   Signed,  Phillips Odor, MD

## 2012-12-11 NOTE — Telephone Encounter (Signed)
Called him to advise, he should come in for this, he states he feels like he is not getting enough air in his lungs. He will d/c the medication and was advised x3 to come in to the office for his feeling like he is not getting enough air in his lungs, he must be seen for this, advised not normal.

## 2013-06-17 ENCOUNTER — Other Ambulatory Visit: Payer: Self-pay

## 2013-12-20 ENCOUNTER — Ambulatory Visit (INDEPENDENT_AMBULATORY_CARE_PROVIDER_SITE_OTHER): Payer: BC Managed Care – PPO | Admitting: Family Medicine

## 2013-12-20 VITALS — BP 98/64 | HR 64 | Temp 98.7°F | Resp 16 | Ht 67.5 in | Wt 150.4 lb

## 2013-12-20 DIAGNOSIS — J309 Allergic rhinitis, unspecified: Secondary | ICD-10-CM

## 2013-12-20 DIAGNOSIS — J302 Other seasonal allergic rhinitis: Secondary | ICD-10-CM

## 2013-12-20 DIAGNOSIS — L84 Corns and callosities: Secondary | ICD-10-CM

## 2013-12-20 MED ORDER — FLUTICASONE PROPIONATE 50 MCG/ACT NA SUSP: 2.0000 | Freq: Every day | NASAL | Status: DC

## 2013-12-20 MED ORDER — ALBUTEROL SULFATE HFA 108 (90 BASE) MCG/ACT IN AERS: 2.0000 | INHALATION_SPRAY | RESPIRATORY_TRACT | Status: DC | PRN

## 2013-12-20 NOTE — Patient Instructions (Signed)

## 2013-12-20 NOTE — Progress Notes (Signed)
Chief Complaint:  Chief Complaint  Patient presents with  . Foot Pain    x 1 mth lt foot    HPI: Tyrone SkillernKoni Hendricks is a 48 y.o. male who is here for 1 month history of left foot pain on his great toe, he has been walking abnormally due to the pain He also has allergies and doing well. HE is from [place] and he never had allergies until he came here, he is well controlled on flonase prn  Past Medical History  Diagnosis Date  . Anxiety   . Depression   . Unspecified constipation   . Hemorrhoids   . Allergy    Past Surgical History  Procedure Laterality Date  . Unremarkable     History   Social History  . Marital Status: Legally Separated    Spouse Name: N/A    Number of Children: N/A  . Years of Education: N/A   Social History Main Topics  . Smoking status: Former Smoker    Quit date: 11/20/2006  . Smokeless tobacco: Never Used  . Alcohol Use: No  . Drug Use: No  . Sexual Activity: Yes   Other Topics Concern  . None   Social History Narrative   [place] from [place].  [place]ame [place] in [place].       Family History  Problem Relation Age of Onset  . Colon cancer Neg Hx   . Colon polyps Neg Hx   . Stomach cancer Father    No Known Allergies Prior to Admission medications   Medication Sig Start Date End Date Taking? Authorizing Provider  fluticasone (FLONASE) 50 MCG/ACT nasal spray Place 2 sprays into the nose daily. 12/11/12  Yes Phillips OdorJeffery Anderson, MD  albuterol (PROVENTIL HFA;VENTOLIN HFA) 108 (90 BASE) MCG/ACT inhaler Inhale 2 puffs into the lungs every 4 (four) hours as needed for wheezing (cough, shortness of breath or wheezing.). 12/11/12   Phillips OdorJeffery Anderson, MD  buPROPion (WELLBUTRIN XL) 150 MG 24 hr tablet Take 2 tablets (300 mg total) by mouth daily. 07/13/12   Morrell RiddleSarah L Weber, PA-C  ibuprofen (ADVIL,MOTRIN) 600 MG tablet Take 1 tablet (600 mg total) by mouth every 8 (eight) hours as needed for pain. 07/03/12   Adlih Moreno-Coll, MD  mometasone (NASONEX) 50 MCG/ACT nasal  spray Place 2 sprays into the nose daily. 11/23/12   Heather Jaquita RectorM Marte, PA-C  Nitroglycerin (RECTIV) 0.4 % OINT Place 1 application rectally 2 (two) times daily. 09/03/12   Mardella Laymanavid R Patterson, MD     ROS: The patient denies fevers, chills, night sweats, unintentional weight loss, chest pain, palpitations, wheezing, dyspnea on exertion, nausea, vomiting, abdominal pain, dysuria, hematuria, melena, numbness, weakness, or tingling.   All other systems have been reviewed and were otherwise negative with the exception of those mentioned in the HPI and as above.    PHYSICAL EXAM: Filed Vitals:   12/20/13 1606  BP: 98/64  Pulse: 64  Temp: 98.7 F (37.1 C)  Resp: 16   Filed Vitals:   12/20/13 1606  Height: 5' 7.5" (1.715 m)  Weight: 150 lb 6.4 oz (68.221 kg)   Body mass index is 23.19 kg/(m^2).  General: Alert, no acute distress HEENT:  Normocephalic, atraumatic, oropharynx patent. EOMI, PERRLA Cardiovascular:  Regular rate and rhythm, no rubs murmurs or gallops.  No Carotid bruits, radial pulse intact. No pedal edema.  Respiratory: Clear to auscultation bilaterally.  No wheezes, rales, or rhonchi.  No cyanosis, no use of accessory musculature GI:  No organomegaly, abdomen is soft and non-tender, positive bowel sounds.  No masses. Skin: No rashes. + callus, we pared it down  Neurologic: Facial musculature symmetric. Psychiatric: Patient is appropriate throughout our interaction. Lymphatic: No cervical lymphadenopathy Musculoskeletal: Gait intact.   LABS: Results for orders placed in visit on 07/13/12  TSH      Result Value Ref Range   TSH 1.975  0.350 - 4.500 uIU/mL  IFOBT (OCCULT BLOOD)      Result Value Ref Range   IFOBT Negative       EKG/XRAY:   [facility]   ASSESSMENT/PLAN: Encounter Diagnoses  Name Primary?  . Seasonal allergies Yes  . Callus of foot    Pared down callus, gave instructions to do at home, he does not have any plantar  warts on exam Refilled albuterol and flonase , printed rx He apparently has had problems takign claritin/allegra and does not want to take it.  F/u prn  Gross sideeffects, risk and benefits, and alternatives of medications d/w patient. Patient is aware that all medications have potential sideeffects and we are unable to predict every sideeffect or drug-drug interaction that may occur.  Lenell Antuhao P Berna Gitto, DO 12/20/2013 5:40 PM

## 2014-12-16 ENCOUNTER — Encounter (HOSPITAL_COMMUNITY): Payer: Self-pay | Admitting: *Deleted

## 2014-12-16 ENCOUNTER — Emergency Department (HOSPITAL_COMMUNITY): Payer: Worker's Compensation

## 2014-12-16 ENCOUNTER — Emergency Department (HOSPITAL_COMMUNITY)
Admission: EM | Admit: 2014-12-16 | Discharge: 2014-12-16 | Disposition: A | Discharge status: Home / Self Care | Payer: Worker's Compensation | Attending: Emergency Medicine | Admitting: Emergency Medicine

## 2014-12-16 DIAGNOSIS — Y9339 Activity, other involving climbing, rappelling and jumping off: Secondary | ICD-10-CM | POA: Diagnosis not present

## 2014-12-16 DIAGNOSIS — Z8719 Personal history of other diseases of the digestive system: Secondary | ICD-10-CM | POA: Insufficient documentation

## 2014-12-16 DIAGNOSIS — S93401A Sprain of unspecified ligament of right ankle, initial encounter: Secondary | ICD-10-CM | POA: Insufficient documentation

## 2014-12-16 DIAGNOSIS — Z8659 Personal history of other mental and behavioral disorders: Secondary | ICD-10-CM | POA: Diagnosis not present

## 2014-12-16 DIAGNOSIS — Z7951 Long term (current) use of inhaled steroids: Secondary | ICD-10-CM | POA: Diagnosis not present

## 2014-12-16 DIAGNOSIS — Y9289 Other specified places as the place of occurrence of the external cause: Secondary | ICD-10-CM | POA: Diagnosis not present

## 2014-12-16 DIAGNOSIS — W1789XA Other fall from one level to another, initial encounter: Secondary | ICD-10-CM | POA: Diagnosis not present

## 2014-12-16 DIAGNOSIS — Z87891 Personal history of nicotine dependence: Secondary | ICD-10-CM | POA: Insufficient documentation

## 2014-12-16 DIAGNOSIS — Y990 Civilian activity done for income or pay: Secondary | ICD-10-CM | POA: Insufficient documentation

## 2014-12-16 DIAGNOSIS — S99911A Unspecified injury of right ankle, initial encounter: Secondary | ICD-10-CM | POA: Diagnosis present

## 2014-12-16 MED ORDER — IBUPROFEN 400 MG PO TABS
600.0000 mg | ORAL_TABLET | Freq: Once | ORAL | Status: AC
  Administered 2014-12-16: 600 mg via ORAL
  Filled 2014-12-16 (×2): qty 1

## 2014-12-16 NOTE — ED Notes (Addendum)
amb to the bathroom, limping on right foot. Offered urinal but declined.

## 2014-12-16 NOTE — ED Notes (Signed)
Patient transported to X-ray 

## 2014-12-16 NOTE — ED Notes (Signed)
Dr. Goldston at the bedside. 

## 2014-12-16 NOTE — ED Notes (Signed)
Pt c/o twisted rt ankle. States he almost fell but caught himself.

## 2014-12-16 NOTE — ED Provider Notes (Signed)
CSN: [id number]     Arrival date & time 12/16/14  0254 History   First MD Initiated Contact with Patient 12/16/14 [phone]     Chief Complaint  Patient presents with  . Ankle Injury     (Consider location/radiation/quality/duration/timing/severity/associated sxs/prior Treatment) HPI  49 year old male presents after a right ankle injury at work. The patient was on a machine at work and jumped down and landed on a cart. He twisted his right ankle when that happened. Did not hit his head or neck. Patient is able to walk on it but it hurts. Has not noticed any swelling. Points to the anterior ankle as the source of most of his pain. No weakness or numbness.  Past Medical History  Diagnosis Date  . Anxiety   . Depression   . Unspecified constipation   . Hemorrhoids   . Allergy    Past Surgical History  Procedure Laterality Date  . Unremarkable     Family History  Problem Relation Age of Onset  . Colon cancer Neg Hx   . Colon polyps Neg Hx   . Stomach cancer Father    History  Substance Use Topics  . Smoking status: Former Smoker    Quit date: 11/20/2006  . Smokeless tobacco: Never Used  . Alcohol Use: No    Review of Systems  Musculoskeletal: Positive for arthralgias. Negative for joint swelling.  Skin: Negative for color change and wound.  Neurological: Negative for weakness and numbness.       Allergies  Review of patient's allergies indicates no known allergies.  Home Medications   Prior to Admission medications   Medication Sig Start Date End Date Taking? Authorizing Provider  fluticasone (FLONASE) 50 MCG/ACT nasal spray Place 2 sprays into both nostrils daily. 12/20/13  Yes Thao P Le, DO  albuterol (PROVENTIL HFA;VENTOLIN HFA) 108 (90 BASE) MCG/ACT inhaler Inhale 2 puffs into the lungs every 4 (four) hours as needed for wheezing (cough, shortness of breath or wheezing.). Patient not taking: Reported on 12/16/2014 12/20/13   Thao P Le, DO  buPROPion (WELLBUTRIN XL) 150  MG 24 hr tablet Take 2 tablets (300 mg total) by mouth daily. Patient not taking: Reported on 12/16/2014 07/13/12   Morrell RiddleSarah L Weber, PA-C  ibuprofen (ADVIL,MOTRIN) 600 MG tablet Take 1 tablet (600 mg total) by mouth every 8 (eight) hours as needed for pain. Patient not taking: Reported on 12/16/2014 07/03/12   Adlih Moreno-Coll, MD  Nitroglycerin (RECTIV) 0.4 % OINT Place 1 application rectally 2 (two) times daily. Patient not taking: Reported on 12/16/2014 09/03/12   Mardella Laymanavid R Patterson, MD   BP 132/73 mmHg  Pulse 73  Temp(Src) 98.2 F (36.8 C) (Oral)  Resp 18  Ht 5\' 7"  (1.702 m)  Wt 150 lb (68.04 kg)  BMI 23.49 kg/m2  SpO2 99% Physical Exam  Constitutional: He is oriented to person, place, and time. He appears well-developed and well-nourished.  HENT:  Head: Normocephalic and atraumatic.  Right Ear: External ear normal.  Left Ear: External ear normal.  Nose: Nose normal.  Eyes: Right eye exhibits no discharge. Left eye exhibits no discharge.  Cardiovascular: Intact distal pulses.   Pulses:      Dorsalis pedis pulses are 2+ on the right side.  Pulmonary/Chest: Effort normal.  Abdominal: He exhibits no distension.  Musculoskeletal: He exhibits no edema.       Right ankle: He exhibits normal range of motion, no swelling and normal pulse. Tenderness. AITFL tenderness found.  Right lower leg: He exhibits no tenderness.       Right foot: There is normal range of motion and no tenderness.  Neurological: He is alert and oriented to person, place, and time.  Skin: Skin is warm and dry. No erythema.  Nursing note and vitals reviewed.   ED Course  Procedures (including critical care time) Labs Review Labs Reviewed - No data to display  Imaging Review Dg Ankle Complete Right  12/16/2014   CLINICAL DATA:  Larey SeatFell from machine, twisted ankle.  EXAM: RIGHT ANKLE - COMPLETE 3+ VIEW  COMPARISON:  None.  FINDINGS: No fracture deformity nor dislocation. The ankle mortise appears congruent and the  tibiofibular syndesmosis intact. No destructive bony lesions. Soft tissue planes are non-suspicious.  IMPRESSION: Negative.   Electronically Signed   By: Awilda Metroourtnay  Bloomer   On: 12/16/2014 03:35     EKG Interpretation None      MDM   Final diagnoses:  Right ankle sprain, initial encounter    Patient with a right ankle sprain. No fibular head tenderness. No ligamentous laxity on exam. Is able to walk but with a limp. We'll place an Ace wrap, give crutches, and NSAIDs. Recommend follow-up with PCP as well as RICE.    Pricilla LovelessScott Yissel Habermehl, MD 12/16/14 [id number]

## 2014-12-16 NOTE — Discharge Instructions (Signed)
°Ankle Sprain °An ankle sprain is an injury to the strong, fibrous tissues (ligaments) that hold the bones of your ankle joint together.  °CAUSES °An ankle sprain is usually caused by a fall or by twisting your ankle. Ankle sprains most commonly occur when you step on the outer edge of your foot, and your ankle turns inward. People who participate in sports are more prone to these types of injuries.  °SYMPTOMS  °· Pain in your ankle. The pain may be present at rest or only when you are trying to stand or walk. °· Swelling. °· Bruising. Bruising may develop immediately or within 1 to 2 days after your injury. °· Difficulty standing or walking, particularly when turning corners or changing directions. °DIAGNOSIS  °Your caregiver will ask you details about your injury and perform a physical exam of your ankle to determine if you have an ankle sprain. During the physical exam, your caregiver will press on and apply pressure to specific areas of your foot and ankle. Your caregiver will try to move your ankle in certain ways. An X-ray exam may be done to be sure a bone was not broken or a ligament did not separate from one of the bones in your ankle (avulsion fracture).  °TREATMENT  °Certain types of braces can help stabilize your ankle. Your caregiver can make a recommendation for this. Your caregiver may recommend the use of medicine for pain. If your sprain is severe, your caregiver may refer you to a surgeon who helps to restore function to parts of your skeletal system (orthopedist) or a physical therapist. °HOME CARE INSTRUCTIONS  °· Apply ice to your injury for 1-2 days or as directed by your caregiver. Applying ice helps to reduce inflammation and pain. °¨ Put ice in a plastic bag. °¨ Place a towel between your skin and the bag. °¨ Leave the ice on for 15-20 minutes at a time, every 2 hours while you are awake. °· Only take over-the-counter or prescription medicines for pain, discomfort, or fever as directed by  your caregiver. °· Elevate your injured ankle above the level of your heart as much as possible for 2-3 days. °· If your caregiver recommends crutches, use them as instructed. Gradually put weight on the affected ankle. Continue to use crutches or a cane until you can walk without feeling pain in your ankle. °· If you have a plaster splint, wear the splint as directed by your caregiver. Do not rest it on anything harder than a pillow for the first 24 hours. Do not put weight on it. Do not get it wet. You may take it off to take a shower or bath. °· You may have been given an elastic bandage to wear around your ankle to provide support. If the elastic bandage is too tight (you have numbness or tingling in your foot or your foot becomes cold and blue), adjust the bandage to make it comfortable. °· If you have an air splint, you may blow more air into it or let air out to make it more comfortable. You may take your splint off at night and before taking a shower or bath. Wiggle your toes in the splint several times per day to decrease swelling. °SEEK MEDICAL CARE IF:  °· You have rapidly increasing bruising or swelling. °· Your toes feel extremely cold or you lose feeling in your foot. °· Your pain is not relieved with medicine. °SEEK IMMEDIATE MEDICAL CARE IF: °· Your toes are numb or blue. °·   You have severe pain that is increasing. °MAKE SURE YOU:  °· Understand these instructions. °· Will watch your condition. °· Will get help right away if you are not doing well or get worse. °Document Released: 07/29/2005 Document Revised: 04/22/2012 Document Reviewed: 08/10/2011 °ExitCare® Patient Information ©2015 [employer]. This information is not intended to replace advice given to you by your health care provider. Make sure you discuss any questions you have with your health care provider. °Elastic Bandage and RICE °Elastic bandages come in different shapes and sizes. They perform different functions. Your caregiver will  help you to decide what is best for your protection, recovery, or rehabilitation following an injury. The following are some general tips to help you use an elastic bandage. °· Use the bandage as directed by the maker of the bandage you are using. °· Do not wrap it too tight. This may cut off the circulation of the arm or leg below the bandage. °· If part of your body beyond the bandage becomes blue, numb, or swollen, it is too tight. Loosen the bandage as needed to prevent these problems. °· See your caregiver or trainer if the bandage seems to be making your problems worse rather than better. °Bandages may be a reminder to you that you have an injury. However, they provide very little support. The few pounds of support they provide are minor considering the pressure it takes to injure a joint or tear ligaments. Therefore, the joint will not be able to handle all of the wear and tear it could before the injury. °The routine care of many injuries includes Rest, Ice, Compression, and Elevation (RICE). °· Rest is required to allow your body to heal. Generally, routine activities can be resumed when comfortable. Injured tendons and bones take about 6 weeks to heal. °· Icing the injury helps keep the swelling down and reduces pain. Do not apply ice directly to the skin. Put ice in a plastic bag. Place a towel between the skin and the bag. This will prevent frostbite to the skin. Apply ice bags to the injured area for 15-20 minutes, every 2 hours while awake. Do this for the first 24 to 48 hours, then as directed by your caregiver. °· Compression helps keep swelling down, gives support, and helps with discomfort. If an elastic bandage has been applied today, it should be removed and reapplied every 3 to 4 hours. It should not be applied tightly, but firmly enough to keep swelling down. Watch fingers or toes for swelling, bluish discoloration, coldness, numbness, or increased pain. If any of these problems occur, remove  the bandage and reapply it more loosely. If these problems persist, contact your caregiver. °· Elevation helps reduce swelling and decreases pain. The injured area (arms, hands, legs, or feet) should be placed near to or above the heart (center of the chest) if able. °Persistent pain and inability to use the injured area for more than 2 to 3 days are warning signs. You should see a caregiver for a follow-up visit as soon as possible. Initially, a minor broken bone (hairline fracture) may not be seen on X-rays. It may take 7 to 10 days to finally show up. Continued pain and swelling show that further evaluation and/or X-rays are needed. Make a follow-up visit with your caregiver. A specialist in reading X-rays (radiologist) will read your X-rays again. °Finding out the results of your test °Not all test results are available during your visit. If your test results are not   back during the visit, make an appointment with your caregiver to find out the results. Do not assume everything is normal if you have not heard from your caregiver or the medical facility. It is important for you to follow up on all of your test results. °Document Released: 01/18/2002 Document Revised: 10/21/2011 Document Reviewed: 11/30/2007 °ExitCare® Patient Information ©2015 [employer]. This information is not intended to replace advice given to you by your health care provider. Make sure you discuss any questions you have with your health care provider. ° °

## 2015-07-04 ENCOUNTER — Ambulatory Visit (INDEPENDENT_AMBULATORY_CARE_PROVIDER_SITE_OTHER): Payer: BLUE CROSS/BLUE SHIELD | Admitting: Family Medicine

## 2015-07-04 ENCOUNTER — Encounter: Payer: Self-pay | Admitting: Family Medicine

## 2015-07-04 VITALS — BP 108/69 | HR 66 | Temp 97.9°F | Resp 16 | Ht 67.5 in | Wt 156.4 lb

## 2015-07-04 DIAGNOSIS — Z1389 Encounter for screening for other disorder: Secondary | ICD-10-CM | POA: Diagnosis not present

## 2015-07-04 DIAGNOSIS — Z0000 Encounter for general adult medical examination without abnormal findings: Secondary | ICD-10-CM | POA: Diagnosis not present

## 2015-07-04 DIAGNOSIS — J302 Other seasonal allergic rhinitis: Secondary | ICD-10-CM | POA: Diagnosis not present

## 2015-07-04 DIAGNOSIS — Z130 Encounter for screening for diseases of the blood and blood-forming organs and certain disorders involving the immune mechanism: Secondary | ICD-10-CM

## 2015-07-04 DIAGNOSIS — K5909 Other constipation: Secondary | ICD-10-CM

## 2015-07-04 DIAGNOSIS — Z13220 Encounter for screening for lipoid disorders: Secondary | ICD-10-CM | POA: Diagnosis not present

## 2015-07-04 DIAGNOSIS — K5900 Constipation, unspecified: Secondary | ICD-10-CM

## 2015-07-04 LAB — COMPREHENSIVE METABOLIC PANEL
ALK PHOS: 55 U/L (ref 40–115)
ALT: 17 U/L (ref 9–46)
AST: 23 U/L (ref 10–40)
Albumin: 4.3 g/dL (ref 3.6–5.1)
BILIRUBIN TOTAL: 0.6 mg/dL (ref 0.2–1.2)
BUN: 18 mg/dL (ref 7–25)
CO2: 25 mmol/L (ref 20–31)
Calcium: 9 mg/dL (ref 8.6–10.3)
Chloride: 106 mmol/L (ref 98–110)
Creat: 0.81 mg/dL (ref 0.60–1.35)
Glucose, Bld: 80 mg/dL (ref 65–99)
Potassium: 4.1 mmol/L (ref 3.5–5.3)
Sodium: 139 mmol/L (ref 135–146)
Total Protein: 6.9 g/dL (ref 6.1–8.1)

## 2015-07-04 LAB — LIPID PANEL
CHOL/HDL RATIO: 3.4 ratio (ref <=5.0)
Cholesterol: 172 mg/dL (ref 125–200)
HDL: 51 mg/dL (ref >=40)
LDL Cholesterol: 109 mg/dL (ref <130)
Triglycerides: 62 mg/dL (ref <150)
VLDL: 12 mg/dL (ref <30)

## 2015-07-04 LAB — CBC
HCT: 42.5 % (ref 39.0–52.0)
Hemoglobin: 13.8 g/dL (ref 13.0–17.0)
MCH: 24.6 pg — ABNORMAL LOW (ref 26.0–34.0)
MCHC: 32.5 g/dL (ref 30.0–36.0)
MCV: 75.9 fL — ABNORMAL LOW (ref 78.0–100.0)
MPV: 10.5 fL (ref 8.6–12.4)
PLATELETS: 183 10*3/uL (ref 150–400)
RBC: 5.6 MIL/uL (ref 4.22–5.81)
RDW: 14.9 % (ref 11.5–15.5)
WBC: 3.4 10*3/uL — ABNORMAL LOW (ref 4.0–10.5)

## 2015-07-04 NOTE — Patient Instructions (Signed)

## 2015-07-04 NOTE — Progress Notes (Signed)
   Subjective:    Patient ID: Tyrone Hendricks, male    DOB: 1965-08-21, 49 y.o.   MRN: [id number]  HPI This is a pleasant 49 yo male who presents today for CPE. He is married and has a 303 1/47 yo daughter. He works in a [employer]. His job requires infrequent heavy lifting.   Last CPE- 1.5 years Tdap- thinks last year when he had a work related injury Flu-declines Dental-regular Eye- regular Exercise- not regular  Review of Systems  Constitutional: Negative.   HENT: Negative.   Eyes: Negative.   Respiratory: Negative.   Gastrointestinal: Positive for constipation (chonic, uses prune juice, probiotics. ).  Endocrine: Negative.   Genitourinary: Negative.   Musculoskeletal: Positive for back pain (occasional back painr relieved with aspirin, rest and heat.).  Skin: Negative.   Neurological: Negative.   Hematological: Negative.   Psychiatric/Behavioral: Negative.       Objective:   Physical Exam Physical Exam  Constitutional: He is oriented to person, place, and time. He appears well-developed and well-nourished.  HENT:  Head: Normocephalic and atraumatic.  Right Ear: External ear normal.  Left Ear: External ear normal.  Nose: Nose normal.  Mouth/Throat: Oropharynx is clear and moist.  Eyes: Conjunctivae are normal. Pupils are equal, round, and reactive to light.  Neck: Normal range of motion. Neck supple.  Cardiovascular: Normal rate, regular rhythm, normal heart sounds and intact distal pulses.   Pulmonary/Chest: Effort normal and breath sounds normal.  Abdominal: Soft. Bowel sounds are normal. Hernia confirmed negative in the right inguinal area and confirmed negative in the left inguinal area.  Genitourinary: Testes normal and penis normal. Circumcised.  Musculoskeletal: Normal range of motion. He exhibits no edema or tenderness.       Cervical back: Normal.       Thoracic back: Normal.       Lumbar back: Normal.  Lymphadenopathy:    He has no cervical adenopathy.   Right: No inguinal adenopathy present.       Left: No inguinal adenopathy present.  Neurological: He is alert and oriented to person, place, and time. He has normal reflexes.  Skin: Skin is warm and dry.  Psychiatric: He has a normal mood and affect. His behavior is normal. Judgment normal.  Vitals reviewed.  BP 108/69 mmHg  Pulse 66  Temp(Src) 97.9 F (36.6 C) (Oral)  Resp 16  Ht 5' 7.5" (1.715 m)  Wt 156 lb 6.4 oz (70.943 kg)  BMI 24.12 kg/m2 Wt Readings from Last 3 Encounters:  07/04/15 156 lb 6.4 oz (70.943 kg)  12/16/14 150 lb (68.04 kg)  12/20/13 150 lb 6.4 oz (68.221 kg)   Depression screen PHQ 2/9 07/04/2015  Decreased Interest 0  Down, Depressed, Hopeless 0  PHQ - 2 Score 0      Assessment & Plan:  1. Annual physical exam - Encouraged healthy habits- adequate sleep, regular exercise, healthy food choices-   2. Constipation, chronic -Provided written and verbal information regarding diagnosis and treatment.  3. Seasonal allergies - continue flonase, discussed importance of daily use during peak allergy seasons  - suggested OTC long acting, non sedating antihistamine  4. Screening for deficiency anemia - CBC  5. Screening for nephropathy - Comprehensive metabolic panel  6. Screening for lipid disorders - Lipid panel   Olean Reeeborah Kanyah Matsushima, FNP-BC  [facility], [facility]  07/09/2015 12:17 PM

## 2015-10-21 ENCOUNTER — Other Ambulatory Visit: Payer: Self-pay | Admitting: Family Medicine

## 2016-12-03 ENCOUNTER — Other Ambulatory Visit: Payer: Self-pay | Admitting: Family Medicine

## 2016-12-04 ENCOUNTER — Ambulatory Visit (INDEPENDENT_AMBULATORY_CARE_PROVIDER_SITE_OTHER): Payer: BLUE CROSS/BLUE SHIELD | Admitting: Urgent Care

## 2016-12-04 VITALS — BP 101/67 | HR 64 | Temp 97.8°F | Resp 16 | Ht 67.5 in | Wt 157.2 lb

## 2016-12-04 DIAGNOSIS — J3089 Other allergic rhinitis: Secondary | ICD-10-CM | POA: Diagnosis not present

## 2016-12-04 DIAGNOSIS — R0602 Shortness of breath: Secondary | ICD-10-CM

## 2016-12-04 DIAGNOSIS — H7320 Unspecified myringitis, unspecified ear: Secondary | ICD-10-CM

## 2016-12-04 DIAGNOSIS — J0100 Acute maxillary sinusitis, unspecified: Secondary | ICD-10-CM

## 2016-12-04 MED ORDER — AMOXICILLIN 875 MG PO TABS: 875.0000 mg | ORAL_TABLET | Freq: Two times a day (BID) | ORAL | 0 refills | Status: DC

## 2016-12-04 MED ORDER — ALBUTEROL SULFATE HFA 108 (90 BASE) MCG/ACT IN AERS: 2.0000 | INHALATION_SPRAY | Freq: Four times a day (QID) | RESPIRATORY_TRACT | 1 refills | Status: DC | PRN

## 2016-12-04 MED ORDER — PSEUDOEPHEDRINE HCL ER 120 MG PO TB12: 120.0000 mg | ORAL_TABLET | Freq: Two times a day (BID) | ORAL | 3 refills | Status: DC

## 2016-12-04 MED ORDER — FLUTICASONE PROPIONATE 50 MCG/ACT NA SUSP: NASAL | 6 refills | Status: DC

## 2016-12-04 MED ORDER — CETIRIZINE HCL 10 MG PO TABS: 10.0000 mg | ORAL_TABLET | Freq: Every day | ORAL | 11 refills | Status: DC

## 2016-12-04 NOTE — Patient Instructions (Addendum)
Sinusitis, Adult Sinusitis is soreness and inflammation of your sinuses. Sinuses are hollow spaces in the bones around your face. Your sinuses are located:  Around your eyes.  In the middle of your forehead.  Behind your nose.  In your cheekbones. Your sinuses and nasal passages are lined with a stringy fluid (mucus). Mucus normally drains out of your sinuses. When your nasal tissues become inflamed or swollen, the mucus can become trapped or blocked so air cannot flow through your sinuses. This allows bacteria, viruses, and funguses to grow, which leads to infection. Sinusitis can develop quickly and last for 7?10 days (acute) or for more than 12 weeks (chronic). Sinusitis often develops after a cold. What are the causes? This condition is caused by anything that creates swelling in the sinuses or stops mucus from draining, including:  Allergies.  Asthma.  Bacterial or viral infection.  Abnormally shaped bones between the nasal passages.  Nasal growths that contain mucus (nasal polyps).  Narrow sinus openings.  Pollutants, such as chemicals or irritants in the air.  A foreign object stuck in the nose.  A fungal infection. This is rare. What increases the risk? The following factors may make you more likely to develop this condition:  Having allergies or asthma.  Having had a recent cold or respiratory tract infection.  Having structural deformities or blockages in your nose or sinuses.  Having a weak immune system.  Doing a lot of swimming or diving.  Overusing nasal sprays.  Smoking. What are the signs or symptoms? The main symptoms of this condition are pain and a feeling of pressure around the affected sinuses. Other symptoms include:  Upper toothache.  Earache.  Headache.  Bad breath.  Decreased sense of smell and taste.  A cough that may get worse at night.  Fatigue.  Fever.  Thick drainage from your nose. The drainage is often green and it may  contain pus (purulent).  Stuffy nose or congestion.  Postnasal drip. This is when extra mucus collects in the throat or back of the nose.  Swelling and warmth over the affected sinuses.  Sore throat.  Sensitivity to light. How is this diagnosed? This condition is diagnosed based on symptoms, a medical history, and a physical exam. To find out if your condition is acute or chronic, your health care provider may:  Look in your nose for signs of nasal polyps.  Tap over the affected sinus to check for signs of infection.  View the inside of your sinuses using an imaging device that has a light attached (endoscope). If your health care provider suspects that you have chronic sinusitis, you may also:  Be tested for allergies.  Have a sample of mucus taken from your nose (nasal culture) and checked for bacteria.  Have a mucus sample examined to see if your sinusitis is related to an allergy. If your sinusitis does not respond to treatment and it lasts longer than 8 weeks, you may have an MRI or CT scan to check your sinuses. These scans also help to determine how severe your infection is. In rare cases, a bone biopsy may be done to rule out more serious types of fungal sinus disease. How is this treated? Treatment for sinusitis depends on the cause and whether your condition is chronic or acute. If a virus is causing your sinusitis, your symptoms will go away on their own within 10 days. You may be given medicines to relieve your symptoms, including:  Topical nasal decongestants. They   shrink swollen nasal passages and let mucus drain from your sinuses.  Antihistamines. These drugs block inflammation that is triggered by allergies. This can help to ease swelling in your nose and sinuses.  Topical nasal corticosteroids. These are nasal sprays that ease inflammation and swelling in your nose and sinuses.  Nasal saline washes. These rinses can help to get rid of thick mucus in your  nose. If your condition is caused by bacteria, you will be given an antibiotic medicine. If your condition is caused by a fungus, you will be given an antifungal medicine. Surgery may be needed to correct underlying conditions, such as narrow nasal passages. Surgery may also be needed to remove polyps. Follow these instructions at home: Medicines   Take, use, or apply over-the-counter and prescription medicines only as told by your health care provider. These may include nasal sprays.  If you were prescribed an antibiotic medicine, take it as told by your health care provider. Do not stop taking the antibiotic even if you start to feel better. Hydrate and Humidify   Drink enough water to keep your urine clear or pale yellow. Staying hydrated will help to thin your mucus.  Use a cool mist humidifier to keep the humidity level in your home above 50%.  Inhale steam for 10-15 minutes, 3-4 times a day or as told by your health care provider. You can do this in the bathroom while a hot shower is running.  Limit your exposure to cool or dry air. Rest   Rest as much as possible.  Sleep with your head raised (elevated).  Make sure to get enough sleep each night. General instructions   Apply a warm, moist washcloth to your face 3-4 times a day or as told by your health care provider. This will help with discomfort.  Wash your hands often with soap and water to reduce your exposure to viruses and other germs. If soap and water are not available, use hand sanitizer.  Do not smoke. Avoid being around people who are smoking (secondhand smoke).  Keep all follow-up visits as told by your health care provider. This is important. Contact a health care provider if:  You have a fever.  Your symptoms get worse.  Your symptoms do not improve within 10 days. Get help right away if:  You have a severe headache.  You have persistent vomiting.  You have pain or swelling around your face or  eyes.  You have vision problems.  You develop confusion.  Your neck is stiff.  You have trouble breathing. This information is not intended to replace advice given to you by your health care provider. Make sure you discuss any questions you have with your health care provider. Document Released: 07/29/2005 Document Revised: 03/24/2016 Document Reviewed: 05/24/2015 Elsevier Interactive Patient Education  2017 Else[employer].    Allergic Rhinitis Allergic rhinitis is when the mucous membranes in the nose respond to allergens. Allergens are particles in the air that cause your body to have an allergic reaction. This causes you to release allergic antibodies. Through a chain of events, these eventually cause you to release histamine into the blood stream. Although meant to protect the body, it is this release of histamine that causes your discomfort, such as frequent sneezing, congestion, and an itchy, runny nose. What are the causes? Seasonal allergic rhinitis (hay fever) is caused by pollen allergens that may come from grasses, trees, and weeds. Year-round allergic rhinitis (perennial allergic rhinitis) is caused by allergens such  by allergens such as house dust mites, pet dander, and mold spores. What are the signs or symptoms?  Nasal stuffiness (congestion).  Itchy, runny nose with sneezing and tearing of the eyes. How is this diagnosed? Your health care provider can help you determine the allergen or allergens that trigger your symptoms. If you and your health care provider are unable to determine the allergen, skin or blood testing may be used. Your health care provider will diagnose your condition after taking your health history and performing a physical exam. Your health care provider may assess you for other related conditions, such as asthma, pink eye, or an ear infection. How is this treated? Allergic rhinitis does not have a cure, but it can be controlled by:  Medicines that block allergy  symptoms. These may include allergy shots, nasal sprays, and oral antihistamines.  Avoiding the allergen.  Hay fever may often be treated with antihistamines in pill or nasal spray forms. Antihistamines block the effects of histamine. There are over-the-counter medicines that may help with nasal congestion and swelling around the eyes. Check with your health care provider before taking or giving this medicine. If avoiding the allergen or the medicine prescribed do not work, there are many new medicines your health care provider can prescribe. Stronger medicine may be used if initial measures are ineffective. Desensitizing injections can be used if medicine and avoidance does not work. Desensitization is when a patient is given ongoing shots until the body becomes less sensitive to the allergen. Make sure you follow up with your health care provider if problems continue. Follow these instructions at home: It is not possible to completely avoid allergens, but you can reduce your symptoms by taking steps to limit your exposure to them. It helps to know exactly what you are allergic to so that you can avoid your specific triggers. Contact a health care provider if:  You have a fever.  You develop a cough that does not stop easily (persistent).  You have shortness of breath.  You start wheezing.  Symptoms interfere with normal daily activities. This information is not intended to replace advice given to you by your health care provider. Make sure you discuss any questions you have with your health care provider. Document Released: 04/23/2001 Document Revised: 03/29/2016 Document Reviewed: 04/05/2013 Elsevier Interactive Patient Education  2017 Elsevier Inc.     IF you received an x-ray today, you will receive an invoice from [facility]. Please contact [facility] at [phone] with questions or concerns regarding your invoice.   IF you received labwork today, you will  receive an invoice from [facility]. Please contact [facility] at [phone] with questions or concerns regarding your invoice.   Our billing staff will not be able to assist you with questions regarding bills from these companies.  You will be contacted with the lab results as soon as they are available. The fastest way to get your results is to activate your My Chart account. Instructions are located on the last page of this paperwork. If you have not heard from us regarding the results in 2 weeks, please contact this office.     

## 2016-12-04 NOTE — Progress Notes (Signed)
  MRN: [id number] DOB: December 06, 1965  Subjective:   Tyrone Hendricks is a 51 y.o. male presenting for chief complaint of Medication Refill (FOR ALLERGIES - runny nose and cough 3-4 days) and Shortness of Breath (couple of days)  Reports 4 week history of runny nose, nasal congestion, intermittent shob, mild wheezing, itchy eyes and scratchy throat. Patient was using Flonase but ran out of this medication. Denies fever, eye pain, ear drainage, ear pain, sore throat, chest pain, n/v, abdominal pain, rashes. Denies smoking cigarettes.  Tyrone Hendricks has a current medication list which includes the following prescription(s): fluticasone. Also has No Known Allergies.  Tyrone Hendricks  has a past medical history of Allergy; Anxiety; Depression; Hemorrhoids; and Unspecified constipation. Also denies past surgical history.   Objective:   Vitals: BP 101/67 (BP Location: Right Arm, Patient Position: Sitting, Cuff Size: Normal)   Pulse 64   Temp 97.8 F (36.6 C) (Oral)   Resp 16   Ht 5' 7.5" (1.715 m)   Wt 157 lb 3.2 oz (71.3 kg)   SpO2 95%   BMI 24.26 kg/m   Physical Exam  Constitutional: He is oriented to person, place, and time. He appears well-developed and well-nourished.  HENT:  Right TM erythematous and bulging. Left TM with effusion. Nasal turbinates erythematous, edematous, nasal passages minimally patent, with bilateral maxillary sinus tenderness.  Mild postnasal drip present but without oropharyngeal exudates, erythema or abscesses.   Eyes: Right eye exhibits no discharge. Left eye exhibits no discharge. No scleral icterus.  Neck: Normal range of motion. Neck supple.  Cardiovascular: Normal rate, regular rhythm and intact distal pulses.  Exam reveals no gallop and no friction rub.   No murmur heard. Pulmonary/Chest: No respiratory distress. He has no wheezes. He has no rales.  Lymphadenopathy:    He has no cervical adenopathy.  Neurological: He is alert and oriented to person, place, and time.  Skin:  Skin is warm and dry.  Psychiatric: He has a normal mood and affect.   Assessment and Plan :   1. Non-seasonal allergic rhinitis, unspecified trigger - Hold off on Flonase until sinusitis resolves. Start Zyrtec with Sudafed in the meantime. - cetirizine (ZYRTEC) 10 MG tablet; Take 1 tablet (10 mg total) by mouth daily.  Dispense: 30 tablet; Refill: 11 - pseudoephedrine (SUDAFED 12 HOUR) 120 MG 12 hr tablet; Take 1 tablet (120 mg total) by mouth 2 (two) times daily.  Dispense: 30 tablet; Refill: 3 - fluticasone (FLONASE) 50 MCG/ACT nasal spray; PLACE 2 SPRAYS IN EACH NOSTRIL DAILY  Dispense: 16 g; Refill: 6  2. Acute maxillary sinusitis, recurrence not specified 3. Tympanic membrane inflammation - Will cover for secondary infection with Amoxicillin. RTC in 1 week if no improvement.  4. Shortness of breath - Offered albuterol script. Instructed patient to rtc if he ends up using this consistently throughout the week.   Wallis Bamberg, PA-C [facility] [phone] 12/04/2016  5:31 PM

## 2017-04-18 ENCOUNTER — Ambulatory Visit (INDEPENDENT_AMBULATORY_CARE_PROVIDER_SITE_OTHER): Payer: BLUE CROSS/BLUE SHIELD | Admitting: Urgent Care

## 2017-04-18 ENCOUNTER — Encounter: Payer: Self-pay | Admitting: Urgent Care

## 2017-04-18 VITALS — BP 108/71 | HR 69 | Temp 98.0°F | Resp 18 | Ht 67.5 in | Wt 157.8 lb

## 2017-04-18 DIAGNOSIS — Z23 Encounter for immunization: Secondary | ICD-10-CM

## 2017-04-18 DIAGNOSIS — R142 Eructation: Secondary | ICD-10-CM | POA: Diagnosis not present

## 2017-04-18 DIAGNOSIS — Z8619 Personal history of other infectious and parasitic diseases: Secondary | ICD-10-CM | POA: Diagnosis not present

## 2017-04-18 DIAGNOSIS — R0789 Other chest pain: Secondary | ICD-10-CM

## 2017-04-18 MED ORDER — GI COCKTAIL ~~LOC~~
30.0000 mL | Freq: Once | ORAL | Status: AC
  Administered 2017-04-18: 30 mL via ORAL

## 2017-04-18 MED ORDER — RANITIDINE HCL 150 MG PO TABS: 150.0000 mg | ORAL_TABLET | Freq: Two times a day (BID) | ORAL | 0 refills | Status: DC

## 2017-04-18 MED ORDER — OMEPRAZOLE 20 MG PO CPDR: 20.0000 mg | DELAYED_RELEASE_CAPSULE | Freq: Every day | ORAL | 3 refills | Status: DC

## 2017-04-18 NOTE — Progress Notes (Signed)
  MRN: [id number] DOB: [birth year]  Subjective:   Tyrone Hendricks is a 51 y.o. male presenting for chief complaint of Chest Pain (x1 week; feels like a burning sensation; denies N/V) and Flu Vaccine  Reports 1 week history of mid sternal chest pain, can radiate to "entire body". Pain is sharp, has burping associated with it, is elicited with food but can also occur randomly. Has tried Tums, Pepcid, Prilosec intermittently with some relief. Denies fever, cough, shob, heart racing, palpitations, diaphoresis, n/v, abdominal pain. Denies smoking cigarettes. He quit smoking 10 years ago. Smoked for ~20 years off and on, 1/2ppd. Denies alcohol use. Admits personal history of H. Pylori infection ~4 years ago, treated with antibiotic course. Denies family history of heart disease, MI, diabetes.   Tyrone Hendricks is not currently taking any medications. Also has No Known Allergies.  Tyrone Hendricks  has a past medical history of Allergy; Anxiety; Depression; Hemorrhoids; and Unspecified constipation. Denies past surgical history.  Objective:   Vitals: BP 108/71   Pulse 69   Temp 98 F (36.7 C) (Oral)   Resp 18   Ht 5' 7.5" (1.715 m)   Wt 157 lb 12.8 oz (71.6 kg)   SpO2 97%   BMI 24.35 kg/m   Physical Exam  Constitutional: He appears well-developed and well-nourished.  HENT:  Mouth/Throat: Oropharynx is clear and moist.  Eyes: No scleral icterus.  Neck: Normal range of motion. Neck supple.  Cardiovascular: Normal rate, regular rhythm and intact distal pulses.  Exam reveals no gallop and no friction rub.   No murmur heard. No carotid bruits.  Pulmonary/Chest: No respiratory distress. He has no wheezes. He has no rales. He exhibits tenderness (mild over midsternum).  Abdominal: Soft. Bowel sounds are normal. He exhibits no distension and no mass. There is no tenderness. There is no guarding.  Musculoskeletal: He exhibits no edema.  Lymphadenopathy:    He has no cervical adenopathy.  Neurological: He is alert.  Skin:  Skin is warm and dry.  Psychiatric: He has a normal mood and affect.   ECG interpretation - normal sinus rhythm at 61bpm.  Assessment and Plan :   1. Chest discomfort 2. Burping - Physical exam findings and ecg reassuring. Will manage as GERD. Start prilosec with ranitidine. Return-to-clinic precautions discussed, patient verbalized understanding.  - gi cocktail (Maalox,Lidocaine,Donnatal); Take 30 mLs by mouth once.  3. History of Helicobacter pylori infection - Labs pending.  4. Need for influenza vaccination - Flu Vaccine QUAD 6+ mos PF IM (Fluarix Quad PF)   Wallis BambergMario Lashanda Storlie, PA-C [facility] [phone] 04/18/2017  5:26 PM

## 2017-04-18 NOTE — Patient Instructions (Addendum)
Nonspecific Chest Pain Chest pain can be caused by many different conditions. There is always a chance that your pain could be related to something serious, such as a heart attack or a blood clot in your lungs. Chest pain can also be caused by conditions that are not life-threatening. If you have chest pain, it is very important to follow up with your health care provider. What are the causes? Causes of this condition include:  Heartburn.  Pneumonia or bronchitis.  Anxiety or stress.  Inflammation around your heart (pericarditis) or lung (pleuritis or pleurisy).  A blood clot in your lung.  A collapsed lung (pneumothorax). This can develop suddenly on its own (spontaneous pneumothorax) or from trauma to the chest.  Shingles infection (varicella-zoster virus).  Heart attack.  Damage to the bones, muscles, and cartilage that make up your chest wall. This can include: ? Bruised bones due to injury. ? Strained muscles or cartilage due to frequent or repeated coughing or overwork. ? Fracture to one or more ribs. ? Sore cartilage due to inflammation (costochondritis).  What increases the risk? Risk factors for this condition may include:  Activities that increase your risk for trauma or injury to your chest.  Respiratory infections or conditions that cause frequent coughing.  Medical conditions or overeating that can cause heartburn.  Heart disease or family history of heart disease.  Conditions or health behaviors that increase your risk of developing a blood clot.  Having had chicken pox (varicella zoster).  What are the signs or symptoms? Chest pain can feel like:  Burning or tingling on the surface of your chest or deep in your chest.  Crushing, pressure, aching, or squeezing pain.  Dull or sharp pain that is worse when you move, cough, or take a deep breath.  Pain that is also felt in your back, neck, shoulder, or arm, or pain that spreads to any of these  areas.  Your chest pain may come and go, or it may stay constant. How is this diagnosed? Lab tests or other studies may be needed to find the cause of your pain. Your health care provider may have you take a test called an ECG (electrocardiogram). An ECG records your heartbeat patterns at the time the test is performed. You may also have other tests, such as:  Transthoracic echocardiogram (TTE). In this test, sound waves are used to create a picture of the heart structures and to look at how blood flows through your heart.  Transesophageal echocardiogram (TEE).This is a more advanced imaging test that takes images from inside your body. It allows your health care provider to see your heart in finer detail.  Cardiac monitoring. This allows your health care provider to monitor your heart rate and rhythm in real time.  Holter monitor. This is a portable device that records your heartbeat and can help to diagnose abnormal heartbeats. It allows your health care provider to track your heart activity for several days, if needed.  Stress tests. These can be done through exercise or by taking medicine that makes your heart beat more quickly.  Blood tests.  Other imaging tests.  How is this treated? Treatment depends on what is causing your chest pain. Treatment may include:  Medicines. These may include: ? Acid blockers for heartburn. ? Anti-inflammatory medicine. ? Pain medicine for inflammatory conditions. ? Antibiotic medicine, if an infection is present. ? Medicines to dissolve blood clots. ? Medicines to treat coronary artery disease (CAD).  Supportive care for conditions that   do not require medicines. This may include: ? Resting. ? Applying heat or cold packs to injured areas. ? Limiting activities until pain decreases.  Follow these instructions at home: Medicines  If you were prescribed an antibiotic, take it as told by your health care provider. Do not stop taking the  antibiotic even if you start to feel better.  Take over-the-counter and prescription medicines only as told by your health care provider. Lifestyle  Do not use any products that contain nicotine or tobacco, such as cigarettes and e-cigarettes. If you need help quitting, ask your health care provider.  Do not drink alcohol.  Make lifestyle changes as directed by your health care provider. These may include: ? Getting regular exercise. Ask your health care provider to suggest some activities that are safe for you. ? Eating a heart-healthy diet. A registered dietitian can help you to learn healthy eating options. ? Maintaining a healthy weight. ? Managing diabetes, if necessary. ? Reducing stress, such as with yoga or relaxation techniques. General instructions  Avoid any activities that bring on chest pain.  If heartburn is the cause for your chest pain, raise (elevate) the head of your bed about 6 inches (15 cm) by putting blocks under the legs. Sleeping with more pillows does not effectively relieve heartburn because it only changes the position of your head.  Keep all follow-up visits as told by your health care provider. This is important. This includes any further testing if your chest pain does not go away. Contact a health care provider if:  Your chest pain does not go away.  You have a rash with blisters on your chest.  You have a fever.  You have chills. Get help right away if:  Your chest pain is worse.  You have a cough that gets worse, or you cough up blood.  You have severe pain in your abdomen.  You have severe weakness.  You faint.  You have sudden, unexplained chest discomfort.  You have sudden, unexplained discomfort in your arms, back, neck, or jaw.  You have shortness of breath at any time.  You suddenly start to sweat, or your skin gets clammy.  You feel nauseous or you vomit.  You suddenly feel light-headed or dizzy.  Your heart begins to beat  quickly, or it feels like it is skipping beats. These symptoms may represent a serious problem that is an emergency. Do not wait to see if the symptoms will go away. Get medical help right away. Call your local emergency services (911 in the [place] Do not drive yourself to the hospital. This information is not intended to replace advice given to you by your health care provider. Make sure you discuss any questions you have with your health care provider. Document Released: 05/08/2005 Document Revised: 04/22/2016 Document Reviewed: 04/22/2016 Elsevier Interactive Patient Education  2017 Elsevier Inc.     IF you received an x-ray today, you will receive an invoice from [facility]. Please contact [facility] at [phone] with questions or concerns regarding your invoice.   IF you received labwork today, you will receive an invoice from [facility]. Please contact [facility] at [phone] with questions or concerns regarding your invoice.   Our billing staff will not be able to assist you with questions regarding bills from these companies.  You will be contacted with the lab results as soon as they are available. The fastest way to get your results is to activate your My Chart account. Instructions are located   on the last page of this paperwork. If you have not heard from us regarding the results in 2 weeks, please contact this office.      

## 2017-04-19 LAB — LIPID PANEL
Chol/HDL Ratio: 3.4 ratio (ref 0.0–5.0)
Cholesterol, Total: 172 mg/dL (ref 100–199)
HDL: 51 mg/dL (ref >39)
LDL CALC: 98 mg/dL (ref 0–99)
TRIGLYCERIDES: 115 mg/dL (ref 0–149)
VLDL CHOLESTEROL CAL: 23 mg/dL (ref 5–40)

## 2017-04-19 LAB — BASIC METABOLIC PANEL
BUN/Creatinine Ratio: 11 (ref 9–20)
BUN: 11 mg/dL (ref 6–24)
CALCIUM: 9.6 mg/dL (ref 8.7–10.2)
CHLORIDE: 105 mmol/L (ref 96–106)
CO2: 20 mmol/L (ref 20–29)
Creatinine, Ser: 0.97 mg/dL (ref 0.76–1.27)
GFR calc Af Amer: 106 mL/min/{1.73_m2} (ref >59)
GFR calc non Af Amer: 91 mL/min/{1.73_m2} (ref >59)
Glucose: 116 mg/dL — ABNORMAL HIGH (ref 65–99)
POTASSIUM: 4 mmol/L (ref 3.5–5.2)
Sodium: 140 mmol/L (ref 134–144)

## 2017-04-19 LAB — CBC
HEMATOCRIT: 43.2 % (ref 37.5–51.0)
HEMOGLOBIN: 13.9 g/dL (ref 13.0–17.7)
MCH: 25 pg — ABNORMAL LOW (ref 26.6–33.0)
MCHC: 32.2 g/dL (ref 31.5–35.7)
MCV: 78 fL — ABNORMAL LOW (ref 79–97)
Platelets: 191 10*3/uL (ref 150–379)
RBC: 5.57 x10E6/uL (ref 4.14–5.80)
RDW: 15.8 % — ABNORMAL HIGH (ref 12.3–15.4)
WBC: 3.8 10*3/uL (ref 3.4–10.8)

## 2017-04-19 LAB — TSH: TSH: 1.72 u[IU]/mL (ref 0.450–4.500)

## 2017-04-22 LAB — H. PYLORI BREATH TEST: H pylori Breath Test: NEGATIVE

## 2017-04-25 ENCOUNTER — Encounter: Payer: BLUE CROSS/BLUE SHIELD | Admitting: Urgent Care

## 2017-07-08 ENCOUNTER — Encounter: Payer: Self-pay | Admitting: Emergency Medicine

## 2017-07-08 ENCOUNTER — Ambulatory Visit: Payer: BLUE CROSS/BLUE SHIELD | Admitting: Emergency Medicine

## 2017-07-08 VITALS — BP 122/72 | HR 71 | Temp 98.4°F | Resp 17 | Ht 67.5 in | Wt 152.0 lb

## 2017-07-08 DIAGNOSIS — R059 Cough, unspecified: Secondary | ICD-10-CM

## 2017-07-08 DIAGNOSIS — J029 Acute pharyngitis, unspecified: Secondary | ICD-10-CM | POA: Diagnosis not present

## 2017-07-08 DIAGNOSIS — R0789 Other chest pain: Secondary | ICD-10-CM

## 2017-07-08 DIAGNOSIS — R05 Cough: Secondary | ICD-10-CM | POA: Diagnosis not present

## 2017-07-08 DIAGNOSIS — J069 Acute upper respiratory infection, unspecified: Secondary | ICD-10-CM

## 2017-07-08 MED ORDER — OMEPRAZOLE 20 MG PO CPDR: 20.0000 mg | DELAYED_RELEASE_CAPSULE | Freq: Every day | ORAL | 3 refills | Status: DC

## 2017-07-08 MED ORDER — PREDNISONE 20 MG PO TABS: 40.0000 mg | ORAL_TABLET | Freq: Every day | ORAL | 0 refills | Status: AC

## 2017-07-08 MED ORDER — BENZONATATE 200 MG PO CAPS: 200.0000 mg | ORAL_CAPSULE | Freq: Two times a day (BID) | ORAL | 0 refills | Status: DC | PRN

## 2017-07-08 MED ORDER — PSEUDOEPHEDRINE-GUAIFENESIN ER 60-600 MG PO TB12: 1.0000 | ORAL_TABLET | Freq: Two times a day (BID) | ORAL | 1 refills | Status: AC

## 2017-07-08 NOTE — Patient Instructions (Addendum)
     IF you received an x-ray today, you will receive an invoice from [facility]. Please contact  Radiology at [phone] with questions or concerns regarding your invoice.   IF you received labwork today, you will receive an invoice from [facility]. Please contact [facility] at [phone] with questions or concerns regarding your invoice.   Our billing staff will not be able to assist you with questions regarding bills from these companies.  You will be contacted with the lab results as soon as they are available. The fastest way to get your results is to activate your My Chart account. Instructions are located on the last page of this paperwork. If you have not heard from us regarding the results in 2 weeks, please contact this office.     Upper Respiratory Infection, Adult Most upper respiratory infections (URIs) are caused by a virus. A URI affects the nose, throat, and upper air passages. The most common type of URI is often called "the common cold." Follow these instructions at home:  Take medicines only as told by your doctor.  Gargle warm saltwater or take cough drops to comfort your throat as told by your doctor.  Use a warm mist humidifier or inhale steam from a shower to increase air moisture. This may make it easier to breathe.  Drink enough fluid to keep your pee (urine) clear or pale yellow.  Eat soups and other clear broths.  Have a healthy diet.  Rest as needed.  Go back to work when your fever is gone or your doctor says it is okay. ? You may need to stay home longer to avoid giving your URI to others. ? You can also wear a face mask and wash your hands often to prevent spread of the virus.  Use your inhaler more if you have asthma.  Do not use any tobacco products, including cigarettes, chewing tobacco, or electronic cigarettes. If you need help quitting, ask your doctor. Contact a doctor if:  You are getting worse, not better.  Your  symptoms are not helped by medicine.  You have chills.  You are getting more short of breath.  You have brown or red mucus.  You have yellow or brown discharge from your nose.  You have pain in your face, especially when you bend forward.  You have a fever.  You have puffy (swollen) neck glands.  You have pain while swallowing.  You have white areas in the back of your throat. Get help right away if:  You have very bad or constant: ? Headache. ? Ear pain. ? Pain in your forehead, behind your eyes, and over your cheekbones (sinus pain). ? Chest pain.  You have long-lasting (chronic) lung disease and any of the following: ? Wheezing. ? Long-lasting cough. ? Coughing up blood. ? A change in your usual mucus.  You have a stiff neck.  You have changes in your: ? Vision. ? Hearing. ? Thinking. ? Mood. This information is not intended to replace advice given to you by your health care provider. Make sure you discuss any questions you have with your health care provider. Document Released: 01/15/2008 Document Revised: 03/31/2016 Document Reviewed: 11/03/2013 Elsevier Interactive Patient Education  2018 Elsevier Inc.  

## 2017-07-08 NOTE — Progress Notes (Signed)
Tyrone Hendricks 51 y.o.   Chief Complaint  Patient presents with  . Cough  . Sore Throat    HISTORY OF PRESENT ILLNESS: This is a 51 y.o. male complaining of URI symptoms x 4 days.   URI   This is a new problem. The current episode started in the past 7 days. The problem has been gradually improving. There has been no fever. Associated symptoms include congestion, coughing and a sore throat. Pertinent negatives include no abdominal pain, chest pain, diarrhea, dysuria, headaches, joint pain, nausea, rash, sinus pain, swollen glands, vomiting or wheezing. He has tried nothing for the symptoms.     Prior to Admission medications   Medication Sig Start Date End Date Taking? Authorizing Provider  omeprazole (PRILOSEC) 20 MG capsule Take 1 capsule (20 mg total) by mouth daily. 04/18/17  Yes Wallis BambergMani, Mario, PA-C  ranitidine (ZANTAC) 150 MG tablet Take 1 tablet (150 mg total) by mouth 2 (two) times daily. 04/18/17  Yes Wallis BambergMani, Mario, PA-C    No Known Allergies  Patient Active Problem List   Diagnosis Date Noted  . Constipation, chronic 07/13/2012  . Depression 09/21/2011    Past Medical History:  Diagnosis Date  . Allergy   . Anxiety   . Depression   . Hemorrhoids   . Unspecified constipation     Past Surgical History:  Procedure Laterality Date  . unremarkable      Social History   Socioeconomic History  . Marital status: Legally Separated    Spouse name: Not on file  . Number of children: Not on file  . Years of education: Not on file  . Highest education level: Not on file  Social Needs  . Financial resource strain: Not on file  . Food insecurity - worry: Not on file  . Food insecurity - inability: Not on file  . Transportation needs - medical: Not on file  . Transportation needs - non-medical: Not on file  Occupational History  . Not on file  Tobacco Use  . Smoking status: Former Smoker    Last attempt to quit: 11/20/2006    Years since quitting: 10.6  . Smokeless  tobacco: Never Used  Substance and Sexual Activity  . Alcohol use: No  . Drug use: No  . Sexual activity: Yes  Other Topics Concern  . Not on file  Social History Narrative   [place] from [place]  Came to [place] in [place].        Family History  Problem Relation Age of Onset  . Stomach cancer Father   . Colon cancer Neg Hx   . Colon polyps Neg Hx      Review of Systems  Constitutional: Negative.  Negative for chills and fever.  HENT: Positive for congestion and sore throat. Negative for nosebleeds and sinus pain.   Eyes: Negative.  Negative for discharge and redness.  Respiratory: Positive for cough. Negative for sputum production, shortness of breath and wheezing.   Cardiovascular: Negative.  Negative for chest pain and palpitations.  Gastrointestinal: Negative.  Negative for abdominal pain, diarrhea, nausea and vomiting.  Genitourinary: Negative.  Negative for dysuria.  Musculoskeletal: Negative for joint pain.  Skin: Negative for rash.  Neurological: Negative for dizziness, sensory change, focal weakness and headaches.  Endo/Heme/Allergies: Negative.   All other systems reviewed and are negative.  Vitals:   07/08/17 1039  BP: 122/72  Pulse: 71  Resp: 17  Temp: 98.4 F (36.9 C)  SpO2: 98%  Physical Exam  Constitutional: He is oriented to person, place, and time. He appears well-developed and well-nourished.  HENT:  Head: Normocephalic and atraumatic.  Nose: Nose normal.  Mouth/Throat: Mucous membranes are normal. Posterior oropharyngeal erythema present. No oropharyngeal exudate or posterior oropharyngeal edema.  Eyes: Conjunctivae and EOM are normal. Pupils are equal, round, and reactive to light.  Neck: Normal range of motion. Neck supple. No JVD present. No thyromegaly present.  Cardiovascular: Normal rate, regular rhythm and normal heart sounds.  Pulmonary/Chest: Effort normal and breath sounds normal.  Abdominal: Soft. Bowel sounds are normal. He exhibits no  distension. There is no tenderness.  Musculoskeletal: Normal range of motion.  Lymphadenopathy:    He has no cervical adenopathy.  Neurological: He is alert and oriented to person, place, and time. No sensory deficit. He exhibits normal muscle tone.  Skin: Skin is warm and dry. Capillary refill takes less than 2 seconds.  Psychiatric: He has a normal mood and affect. His behavior is normal.  Vitals reviewed.    ASSESSMENT & PLAN: Linsey was seen today for cough and sore throat.  Diagnoses and all orders for this visit:  Acute upper respiratory infection  Cough  Sore throat  Other orders -     predniSONE (DELTASONE) 20 MG tablet; Take 2 tablets (40 mg total) by mouth daily with breakfast for 5 days. -     benzonatate (TESSALON) 200 MG capsule; Take 1 capsule (200 mg total) by mouth 2 (two) times daily as needed for cough. -     pseudoephedrine-guaifenesin (MUCINEX D) 60-600 MG 12 hr tablet; Take 1 tablet by mouth every 12 (twelve) hours for 5 days.    Patient Instructions       IF you received an x-ray today, you will receive an invoice from [facility]. Please contact [facility] at [phone] with questions or concerns regarding your invoice.   IF you received labwork today, you will receive an invoice from [facility]. Please contact [facility] at [phone] with questions or concerns regarding your invoice.   Our billing staff will not be able to assist you with questions regarding bills from these companies.  You will be contacted with the lab results as soon as they are available. The fastest way to get your results is to activate your My Chart account. Instructions are located on the last page of this paperwork. If you have not heard from [place] regarding the results in 2 weeks, please contact this office.      Upper Respiratory Infection, Adult Most upper respiratory infections (URIs) are caused by a virus. A URI affects the nose, throat, and upper air  passages. The most common type of URI is often called "the common cold." Follow these instructions at home:  Take medicines only as told by your doctor.  Gargle warm saltwater or take cough drops to comfort your throat as told by your doctor.  Use a warm mist humidifier or inhale steam from a shower to increase air moisture. This may make it easier to breathe.  Drink enough fluid to keep your pee (urine) clear or pale yellow.  Eat soups and other clear broths.  Have a healthy diet.  Rest as needed.  Go back to work when your fever is gone or your doctor says it is okay. ? You may need to stay home longer to avoid giving your URI to others. ? You can also wear a face mask and wash your hands often to prevent spread of the virus.  Use your inhaler more if you have asthma.  Do not use any tobacco products, including cigarettes, chewing tobacco, or electronic cigarettes. If you need help quitting, ask your doctor. Contact a doctor if:  You are getting worse, not better.  Your symptoms are not helped by medicine.  You have chills.  You are getting more short of breath.  You have brown or red mucus.  You have yellow or brown discharge from your nose.  You have pain in your face, especially when you bend forward.  You have a fever.  You have puffy (swollen) neck glands.  You have pain while swallowing.  You have white areas in the back of your throat. Get help right away if:  You have very bad or constant: ? Headache. ? Ear pain. ? Pain in your forehead, behind your eyes, and over your cheekbones (sinus pain). ? Chest pain.  You have long-lasting (chronic) lung disease and any of the following: ? Wheezing. ? Long-lasting cough. ? Coughing up blood. ? A change in your usual mucus.  You have a stiff neck.  You have changes in your: ? Vision. ? Hearing. ? Thinking. ? Mood. This information is not intended to replace advice given to you by your health care  provider. Make sure you discuss any questions you have with your health care provider. Document Released: 01/15/2008 Document Revised: 03/31/2016 Document Reviewed: 11/03/2013 Elsevier Interactive Patient Education  2018 Elsevier Inc.      Edwina BarthMiguel Levis Nazir, MD [facility]

## 2017-07-31 ENCOUNTER — Ambulatory Visit (INDEPENDENT_AMBULATORY_CARE_PROVIDER_SITE_OTHER): Payer: BLUE CROSS/BLUE SHIELD | Admitting: Urgent Care

## 2017-07-31 ENCOUNTER — Encounter: Payer: Self-pay | Admitting: Urgent Care

## 2017-07-31 VITALS — BP 105/70 | HR 62 | Temp 97.7°F | Resp 16 | Ht 67.5 in | Wt 152.8 lb

## 2017-07-31 DIAGNOSIS — Z0000 Encounter for general adult medical examination without abnormal findings: Secondary | ICD-10-CM

## 2017-07-31 DIAGNOSIS — Z114 Encounter for screening for human immunodeficiency virus [HIV]: Secondary | ICD-10-CM | POA: Diagnosis not present

## 2017-07-31 NOTE — Patient Instructions (Signed)
Health Maintenance, Male A healthy lifestyle and preventive care is important for your health and wellness. Ask your health care provider about what schedule of regular examinations is right for you. What should I know about weight and diet? Eat a Healthy Diet  Eat plenty of vegetables, fruits, whole grains, low-fat dairy products, and lean protein.  Do not eat a lot of foods high in solid fats, added sugars, or salt.  Maintain a Healthy Weight Regular exercise can help you achieve or maintain a healthy weight. You should:  Do at least 150 minutes of exercise each week. The exercise should increase your heart rate and make you sweat (moderate-intensity exercise).  Do strength-training exercises at least twice a week.  Watch Your Levels of Cholesterol and Blood Lipids  Have your blood tested for lipids and cholesterol every 5 years starting at 49 years of age. If you are at high risk for heart disease, you should start having your blood tested when you are 49 years old. You may need to have your cholesterol levels checked more often if: ? Your lipid or cholesterol levels are high. ? You are older than 50 years of age. ? You are at high risk for heart disease.  What should I know about cancer screening? Many types of cancers can be detected early and may often be prevented. Lung Cancer  You should be screened every year for lung cancer if: ? You are a current smoker who has smoked for at least 30 years. ? You are a former smoker who has quit within the past 15 years.  Talk to your health care provider about your screening options, when you should start screening, and how often you should be screened.  Colorectal Cancer  Routine colorectal cancer screening usually begins at 50 years of age and should be repeated every 5-10 years until you are 49 years old. You may need to be screened more often if early forms of precancerous polyps or small growths are found. Your health care provider  may recommend screening at an earlier age if you have risk factors for colon cancer.  Your health care provider may recommend using home test kits to check for hidden blood in the stool.  A small camera at the end of a tube can be used to examine your colon (sigmoidoscopy or colonoscopy). This checks for the earliest forms of colorectal cancer.  Prostate and Testicular Cancer  Depending on your age and overall health, your health care provider may do certain tests to screen for prostate and testicular cancer.  Talk to your health care provider about any symptoms or concerns you have about testicular or prostate cancer.  Skin Cancer  Check your skin from head to toe regularly.  Tell your health care provider about any new moles or changes in moles, especially if: ? There is a change in a mole's size, shape, or color. ? You have a mole that is larger than a pencil eraser.  Always use sunscreen. Apply sunscreen liberally and repeat throughout the day.  Protect yourself by wearing long sleeves, pants, a wide-brimmed hat, and sunglasses when outside.  What should I know about heart disease, diabetes, and high blood pressure?  If you are 18-39 years of age, have your blood pressure checked every 3-5 years. If you are 40 years of age or older, have your blood pressure checked every year. You should have your blood pressure measured twice-once when you are at a hospital or clinic, and once when   you are not at a hospital or clinic. Record the average of the two measurements. To check your blood pressure when you are not at a hospital or clinic, you can use: ? An automated blood pressure machine at a pharmacy. ? A home blood pressure monitor.  Talk to your health care provider about your target blood pressure.  If you are between 45-79 years old, ask your health care provider if you should take aspirin to prevent heart disease.  Have regular diabetes screenings by checking your fasting blood  sugar level. ? If you are at a normal weight and have a low risk for diabetes, have this test once every three years after the age of 45. ? If you are overweight and have a high risk for diabetes, consider being tested at a younger age or more often.  A one-time screening for abdominal aortic aneurysm (AAA) by ultrasound is recommended for men aged 65-75 years who are current or former smokers. What should I know about preventing infection? Hepatitis B If you have a higher risk for hepatitis B, you should be screened for this virus. Talk with your health care provider to find out if you are at risk for hepatitis B infection. Hepatitis C Blood testing is recommended for:  Everyone born from 1945 through 1965.  Anyone with known risk factors for hepatitis C.  Sexually Transmitted Diseases (STDs)  You should be screened each year for STDs including gonorrhea and chlamydia if: ? You are sexually active and are younger than 49 years of age. ? You are older than 49 years of age and your health care provider tells you that you are at risk for this type of infection. ? Your sexual activity has changed since you were last screened and you are at an increased risk for chlamydia or gonorrhea. Ask your health care provider if you are at risk.  Talk with your health care provider about whether you are at high risk of being infected with HIV. Your health care provider may recommend a prescription medicine to help prevent HIV infection.  What else can I do?  Schedule regular health, dental, and eye exams.  Stay current with your vaccines (immunizations).  Do not use any tobacco products, such as cigarettes, chewing tobacco, and e-cigarettes. If you need help quitting, ask your health care provider.  Limit alcohol intake to no more than 2 drinks per day. One drink equals 12 ounces of beer, 5 ounces of wine, or 1 ounces of hard liquor.  Do not use street drugs.  Do not share needles.  Ask your  health care provider for help if you need support or information about quitting drugs.  Tell your health care provider if you often feel depressed.  Tell your health care provider if you have ever been abused or do not feel safe at home. This information is not intended to replace advice given to you by your health care provider. Make sure you discuss any questions you have with your health care provider. Document Released: 01/25/2008 Document Revised: 03/27/2016 Document Reviewed: 05/02/2015 Elsevier Interactive Patient Education  2018 Elsevier Inc.     IF you received an x-ray today, you will receive an invoice from [facility]. Please contact [facility] at [phone] with questions or concerns regarding your invoice.   IF you received labwork today, you will receive an invoice from [facility]. Please contact [facility] at [phone] with questions or concerns regarding your invoice.   Our billing staff will not be   able to assist you with questions regarding bills from these companies.  You will be contacted with the lab results as soon as they are available. The fastest way to get your results is to activate your My Chart account. Instructions are located on the last page of this paperwork. If you have not heard from us regarding the results in 2 weeks, please contact this office.       

## 2017-07-31 NOTE — Progress Notes (Signed)
MRN: [id number]  Subjective:   Mr. Tyrone Hendricks is a 51 y.o. male presenting for annual physical exam. Patient is married, has 2 kids. Works in Set [employer]. Has good relationships at home, has a good support network. Denies smoking cigarettes or drinking alcohol.   Medical care team includes: PCP: Patient, No Pcp Per Vision: No visual deficits. Wears glasses to drive only. Dental: Cleanings once every 6 months. Specialists: None.    [name] is not currently taking any medications and has No Known Allergies. [name]  has a past medical history of Allergy, Anxiety, Depression, Hemorrhoids, and Unspecified constipation. Also  has a past surgical history that includes unremarkable. His family history includes Stomach cancer in his father.  Immunizations: Up to date, 04/18/2017  Review of Systems  Constitutional: Negative for chills, diaphoresis, fever, malaise/fatigue and weight loss.  HENT: Negative for congestion, ear discharge, ear pain, hearing loss, nosebleeds, sore throat and tinnitus.   Eyes: Negative for blurred vision, double vision, photophobia, pain, discharge and redness.  Respiratory: Negative for cough, shortness of breath and wheezing.   Cardiovascular: Negative for chest pain, palpitations and leg swelling.  Gastrointestinal: Negative for abdominal pain, blood in stool, constipation, diarrhea, nausea and vomiting.  Genitourinary: Negative for dysuria, flank pain, frequency, hematuria and urgency.  Musculoskeletal: Negative for back pain, joint pain and myalgias.  Skin: Negative for itching and rash.  Neurological: Negative for dizziness, tingling, seizures, loss of consciousness, weakness and headaches.  Endo/Heme/Allergies: Negative for polydipsia.  Psychiatric/Behavioral: Negative for depression, hallucinations, memory loss, substance abuse and suicidal ideas. The patient is not nervous/anxious and does not have insomnia.    Objective:   Vitals: BP 105/70   Pulse 62    Temp 97.7 F (36.5 C) (Oral)   Resp 16   Ht 5' 7.5" (1.715 m)   Wt 152 lb 12.8 oz (69.3 kg)   SpO2 97%   BMI 23.58 kg/m    Visual Acuity Screening   Right eye Left eye Both eyes  Without correction: 20/40-1 20/40-1 20/40-1  With correction:       Physical Exam  Constitutional: He is oriented to person, place, and time. He appears well-developed and well-nourished.  HENT:  TM's intact bilaterally, no effusions or erythema. Nasal turbinates pink and moist, nasal passages patent. No sinus tenderness. Oropharynx clear, mucous membranes moist, dentition in good repair.  Eyes: Conjunctivae and EOM are normal. Pupils are equal, round, and reactive to light. Right eye exhibits no discharge. Left eye exhibits no discharge. No scleral icterus.  Neck: Normal range of motion. Neck supple. No thyromegaly present.  Cardiovascular: Normal rate, regular rhythm and intact distal pulses. Exam reveals no gallop and no friction rub.  No murmur heard. Pulmonary/Chest: No stridor. No respiratory distress. He has no wheezes. He has no rales.  Abdominal: Soft. Bowel sounds are normal. He exhibits no distension and no mass. There is no tenderness.  Musculoskeletal: Normal range of motion. He exhibits no edema or tenderness.  Lymphadenopathy:    He has no cervical adenopathy.  Neurological: He is alert and oriented to person, place, and time. He has normal reflexes. He displays normal reflexes.  Skin: Skin is warm and dry. No rash noted. No erythema. No pallor.  Psychiatric: He has a normal mood and affect.   Assessment and Plan :   Annual physical exam - Plan: Comprehensive metabolic panel, Lipid panel, TSH, CANCELED: HIV antibody  Screening for HIV without presence of risk factors - Plan: HIV antibody  Medically stable, pleasant  person. Labs pending. Discussed healthy lifestyle, diet, exercise, preventative care, vaccinations, and addressed patient's concerns.   Wallis BambergMario Vega Stare, PA-C Primary Care at  [facility] [phone] 07/31/2017  1:32 PM

## 2017-08-01 LAB — LIPID PANEL
Chol/HDL Ratio: 2.9 ratio (ref 0.0–5.0)
Cholesterol, Total: 170 mg/dL (ref 100–199)
HDL: 59 mg/dL (ref >39)
LDL CALC: 102 mg/dL — AB (ref 0–99)
TRIGLYCERIDES: 46 mg/dL (ref 0–149)
VLDL Cholesterol Cal: 9 mg/dL (ref 5–40)

## 2017-08-01 LAB — COMPREHENSIVE METABOLIC PANEL
ALT: 11 IU/L (ref 0–44)
AST: 20 IU/L (ref 0–40)
Albumin/Globulin Ratio: 2.3 — ABNORMAL HIGH (ref 1.2–2.2)
Albumin: 4.8 g/dL (ref 3.5–5.5)
Alkaline Phosphatase: 77 IU/L (ref 39–117)
BUN/Creatinine Ratio: 14 (ref 9–20)
BUN: 13 mg/dL (ref 6–24)
Bilirubin Total: 0.3 mg/dL (ref 0.0–1.2)
CALCIUM: 9.1 mg/dL (ref 8.7–10.2)
CO2: 24 mmol/L (ref 20–29)
CREATININE: 0.9 mg/dL (ref 0.76–1.27)
Chloride: 103 mmol/L (ref 96–106)
GFR calc Af Amer: 116 mL/min/{1.73_m2} (ref >59)
GFR, EST NON AFRICAN AMERICAN: 100 mL/min/{1.73_m2} (ref >59)
Globulin, Total: 2.1 g/dL (ref 1.5–4.5)
Glucose: 81 mg/dL (ref 65–99)
Potassium: 4.2 mmol/L (ref 3.5–5.2)
Sodium: 142 mmol/L (ref 134–144)
Total Protein: 6.9 g/dL (ref 6.0–8.5)

## 2017-08-01 LAB — TSH: TSH: 1.9 u[IU]/mL (ref 0.450–4.500)

## 2017-08-01 LAB — HIV ANTIBODY (ROUTINE TESTING W REFLEX): HIV Screen 4th Generation wRfx: NONREACTIVE

## 2017-12-18 ENCOUNTER — Other Ambulatory Visit: Payer: Self-pay | Admitting: Urgent Care

## 2017-12-18 DIAGNOSIS — J3089 Other allergic rhinitis: Secondary | ICD-10-CM

## 2018-10-05 ENCOUNTER — Telehealth: Payer: Self-pay | Admitting: Urgent Care

## 2018-10-05 DIAGNOSIS — J3089 Other allergic rhinitis: Secondary | ICD-10-CM

## 2018-10-16 NOTE — Telephone Encounter (Signed)
Patient was informed we will not refill this medication without an office visit. He have not been seen here since 2018

## 2018-10-16 NOTE — Telephone Encounter (Addendum)
Relation to pt: self  Call back number: [phone] Pharmacy: [id number], [place] - [address] AT [facility] [address] [phone] (Phone) [phone] (Fax)     Reason for call:  Patient scheduled for physical 10/20/2018 requesting fluticasone (FLONASE) 50 MCG/ACT nasal spray [Pharmacy Med Name: FLUTICASONE NASAL to hold him over until appointment, please advise

## 2018-10-20 ENCOUNTER — Other Ambulatory Visit: Payer: Self-pay

## 2018-10-20 ENCOUNTER — Ambulatory Visit (INDEPENDENT_AMBULATORY_CARE_PROVIDER_SITE_OTHER): Payer: Managed Care, Other (non HMO) | Admitting: Family Medicine

## 2018-10-20 ENCOUNTER — Encounter: Payer: Self-pay | Admitting: Family Medicine

## 2018-10-20 VITALS — BP 105/69 | HR 57 | Temp 98.5°F | Resp 18 | Ht 67.5 in | Wt 160.2 lb

## 2018-10-20 DIAGNOSIS — Z1321 Encounter for screening for nutritional disorder: Secondary | ICD-10-CM

## 2018-10-20 DIAGNOSIS — Z13228 Encounter for screening for other metabolic disorders: Secondary | ICD-10-CM

## 2018-10-20 DIAGNOSIS — Z0000 Encounter for general adult medical examination without abnormal findings: Secondary | ICD-10-CM | POA: Diagnosis not present

## 2018-10-20 DIAGNOSIS — Z23 Encounter for immunization: Secondary | ICD-10-CM | POA: Diagnosis not present

## 2018-10-20 DIAGNOSIS — Z1329 Encounter for screening for other suspected endocrine disorder: Secondary | ICD-10-CM | POA: Diagnosis not present

## 2018-10-20 DIAGNOSIS — Z13220 Encounter for screening for lipoid disorders: Secondary | ICD-10-CM | POA: Diagnosis not present

## 2018-10-20 DIAGNOSIS — Z125 Encounter for screening for malignant neoplasm of prostate: Secondary | ICD-10-CM

## 2018-10-20 DIAGNOSIS — Z130 Encounter for screening for diseases of the blood and blood-forming organs and certain disorders involving the immune mechanism: Secondary | ICD-10-CM

## 2018-10-20 NOTE — Progress Notes (Signed)
3/10/20209:49 AM  Tyrone Hendricks 09-26-1965, 53 y.o. male [id number]  Chief Complaint  Patient presents with  . Annual Exam    HPI:   Patient is a 53 y.o. male who presents today for CPE  Last CPE Dec 2018 Colorectal Cancer Screening: 2014, no polyps Prostate Cancer Screening: today HIV Screening: 2018 Seasonal Influenza Vaccination: today Td/Tdap Vaccination: 2008 Zoster Vaccination: unsure, information provided Frequency of Dental evaluation: Q6 months Frequency of Eye evaluation: yearly, wears eyeglasses Originally from [place], in [place] for 20 years Married, 2 children, 1 [place] and 1 [place] Quit smoking and etoh ~ 2008 Walks lots at work, manufacturer   Fall Risk  10/20/2018 07/08/2017 04/18/2017 12/04/2016 07/04/2015  Falls in the past year? 0 No No No No  Follow up Falls evaluation completed - - - -     Depression screen [facility] 2/9 10/20/2018 07/08/2017 04/18/2017  Decreased Interest 1 0 0  Down, Depressed, Hopeless 1 0 0  PHQ - 2 Score 2 0 0  Altered sleeping 1 - -  Tired, decreased energy 1 - -  Change in appetite 0 - -  Feeling bad or failure about yourself  1 - -  Trouble concentrating 0 - -  Moving slowly or fidgety/restless 0 - -  Suicidal thoughts 0 - -  PHQ-9 Score 5 - -    No Known Allergies  Prior to Admission medications   Not on File    Past Medical History:  Diagnosis Date  . Allergy   . Anxiety   . Depression   . Hemorrhoids   . Unspecified constipation     Past Surgical History:  Procedure Laterality Date  . unremarkable      Social History   Tobacco Use  . Smoking status: Former Smoker    Last attempt to quit: 11/20/2006    Years since quitting: 11.9  . Smokeless tobacco: Never Used  Substance Use Topics  . Alcohol use: No    Family History  Problem Relation Age of Onset  . Stomach cancer Father   . Colon cancer Neg Hx   . Colon polyps Neg Hx     Review of Systems  Constitutional: Negative for chills and fever.  Respiratory:  Negative for cough and shortness of breath.   Cardiovascular: Negative for chest pain, palpitations and leg swelling.  Gastrointestinal: Negative for abdominal pain, nausea and vomiting.  All other systems reviewed and are negative.    OBJECTIVE:  Blood pressure 105/69, pulse (!) 57, temperature 98.5 F (36.9 C), temperature source Oral, resp. rate 18, height 5' 7.5" (1.715 m), weight 160 lb 3.2 oz (72.7 kg), SpO2 98 %. Body mass index is 24.72 kg/m.    Visual Acuity Screening   Right eye Left eye Both eyes  Without correction: '20/50 20/30 20/30 '  With correction:       Physical Exam Vitals signs and nursing note reviewed.  Constitutional:      Appearance: He is well-developed.  HENT:     Head: Normocephalic and atraumatic.     Right Ear: Hearing, tympanic membrane, ear canal and external ear normal.     Left Ear: Hearing, tympanic membrane, ear canal and external ear normal.     Mouth/Throat:     Pharynx: No oropharyngeal exudate.  Eyes:     Conjunctiva/sclera: Conjunctivae normal.     Pupils: Pupils are equal, round, and reactive to light.  Neck:     Musculoskeletal: Neck supple.     Thyroid: No thyromegaly.  Cardiovascular:     Rate and Rhythm: Normal rate and regular rhythm.     Heart sounds: Normal heart sounds. No murmur. No friction rub. No gallop.   Pulmonary:     Effort: Pulmonary effort is normal.     Breath sounds: Normal breath sounds. No wheezing, rhonchi or rales.  Abdominal:     General: Bowel sounds are normal. There is no distension.     Palpations: Abdomen is soft. There is no mass.     Tenderness: There is no abdominal tenderness.  Musculoskeletal: Normal range of motion.  Lymphadenopathy:     Cervical: No cervical adenopathy.  Skin:    General: Skin is warm and dry.  Neurological:     Mental Status: He is alert and oriented to person, place, and time.     Cranial Nerves: No cranial nerve deficit.     Coordination: Coordination normal.      Gait: Gait normal.     Deep Tendon Reflexes: Reflexes are normal and symmetric.     ASSESSMENT and PLAN  1. Annual physical exam No concerns per history or exam. Routine HCM labs ordered. HCM reviewed/discussed. Anticipatory guidance regarding healthy weight, lifestyle and choices given.   2. Need for influenza vaccination - Flu Vaccine QUAD 36+ mos IM  3. Screening for endocrine, nutritional, metabolic and immunity disorder - CMP14+EGFR  4. Screening for thyroid disorder - TSH  5. Screening for lipid disorders - Lipid panel  6. Screening for prostate cancer - PSA  Other orders - Td vaccine greater than or equal to 7yo preservative free IM    Return in about 1 year (around 10/20/2019).    Rutherford Guys, MD Primary Care at [place],  [address] Ph.  [phone] Fax [phone]

## 2018-10-20 NOTE — Patient Instructions (Addendum)
If you have lab work done today you will be contacted with your lab results within the next 2 weeks.  If you have not heard from [place] then please contact us. The fastest way to get your results is to register for My Chart.   IF you received an x-ray today, you will receive an invoice from [facility]. Please contact [facility] at [phone] with questions or concerns regarding your invoice.   IF you received labwork today, you will receive an invoice from [facility]. Please contact [facility] at [phone] with questions or concerns regarding your invoice.   Our billing staff will not be able to assist you with questions regarding bills from these companies.  You will be contacted with the lab results as soon as they are available. The fastest way to get your results is to activate your My Chart account. Instructions are located on the last page of this paperwork. If you have not heard from [place] regarding the results in 2 weeks, please contact this office.     Preventive Care 40-64 Years, Male Preventive care refers to lifestyle choices and visits with your health care provider that can promote health and wellness. What does preventive care include?   A yearly physical exam. This is also called an annual well check.  Dental exams once or twice a year.  Routine eye exams. Ask your health care provider how often you should have your eyes checked.  Personal lifestyle choices, including: ? Daily care of your teeth and gums. ? Regular physical activity. ? Eating a healthy diet. ? Avoiding tobacco and drug use. ? Limiting alcohol use. ? Practicing safe sex. ? Taking low-dose aspirin every day starting at age 82. What happens during an annual well check? The services and screenings done by your health care provider during your annual well check will depend on your age, overall health, lifestyle risk factors, and family history of disease. Counseling Your health care  provider may ask you questions about your:  Alcohol use.  Tobacco use.  Drug use.  Emotional well-being.  Home and relationship well-being.  Sexual activity.  Eating habits.  Work and work Statistician. Screening You may have the following tests or measurements:  Height, weight, and BMI.  Blood pressure.  Lipid and cholesterol levels. These may be checked every 5 years, or more frequently if you are over 8 years old.  Skin check.  Lung cancer screening. You may have this screening every year starting at age 76 if you have a 30-pack-year history of smoking and currently smoke or have quit within the past 15 years.  Colorectal cancer screening. All adults should have this screening starting at age 48 and continuing until age 37. Your health care provider may recommend screening at age 70. You will have tests every 1-10 years, depending on your results and the type of screening test. People at increased risk should start screening at an earlier age. Screening tests may include: ? Guaiac-based fecal occult blood testing. ? Fecal immunochemical test (FIT). ? Stool DNA test. ? Virtual colonoscopy. ? Sigmoidoscopy. During this test, a flexible tube with a tiny camera (sigmoidoscope) is used to examine your rectum and lower colon. The sigmoidoscope is inserted through your anus into your rectum and lower colon. ? Colonoscopy. During this test, a long, thin, flexible tube with a tiny camera (colonoscope) is used to examine your entire colon and rectum.  Prostate cancer screening. Recommendations will vary depending on your family history and  other risks.  Hepatitis C blood test.  Hepatitis B blood test.  Sexually transmitted disease (STD) testing.  Diabetes screening. This is done by checking your blood sugar (glucose) after you have not eaten for a while (fasting). You may have this done every 1-3 years. Discuss your test results, treatment options, and if necessary, the need  for more tests with your health care provider. Vaccines Your health care provider may recommend certain vaccines, such as:  Influenza vaccine. This is recommended every year.  Tetanus, diphtheria, and acellular pertussis (Tdap, Td) vaccine. You may need a Td booster every 10 years.  Varicella vaccine. You may need this if you have not been vaccinated.  Zoster vaccine. You may need this after age 52.  Measles, mumps, and rubella (MMR) vaccine. You may need at least one dose of MMR if you were born in 1957 or later. You may also need a second dose.  Pneumococcal 13-valent conjugate (PCV13) vaccine. You may need this if you have certain conditions and have not been vaccinated.  Pneumococcal polysaccharide (PPSV23) vaccine. You may need one or two doses if you smoke cigarettes or if you have certain conditions.  Meningococcal vaccine. You may need this if you have certain conditions.  Hepatitis A vaccine. You may need this if you have certain conditions or if you travel or work in places where you may be exposed to hepatitis A.  Hepatitis B vaccine. You may need this if you have certain conditions or if you travel or work in places where you may be exposed to hepatitis B.  Haemophilus influenzae type b (Hib) vaccine. You may need this if you have certain risk factors. Talk to your health care provider about which screenings and vaccines you need and how often you need them. This information is not intended to replace advice given to you by your health care provider. Make sure you discuss any questions you have with your health care provider. Document Released: 08/25/2015 Document Revised: 09/18/2017 Document Reviewed: 05/30/2015 Elsevier Interactive Patient Education  2019 Elsevier Inc. Recombinant Zoster (Shingles) Vaccine, RZV: What You Need to Know 1. Why get vaccinated? Shingles (also called herpes zoster, or just zoster) is a painful skin rash, often with blisters. Shingles is caused  by the varicella zoster virus, the same virus that causes chickenpox. After you have chickenpox, the virus stays in your body and can cause shingles later in life. You can't catch shingles from another person. However, a person who has never had chickenpox (or chickenpox vaccine) could get chickenpox from someone with shingles. A shingles rash usually appears on one side of the face or body and heals within 2 to 4 weeks. Its main symptom is pain, which can be severe. Other symptoms can include fever, headache, chills, and upset stomach. Very rarely, a shingles infection can lead to pneumonia, hearing problems, blindness, brain inflammation (encephalitis), or death. For about 1 person in 5, severe pain can continue even long after the rash has cleared up. This long-lasting pain is called post-herpetic neuralgia (PHN). Shingles is far more common in people 49 years of age and older than in younger people, and the risk increases with age. It is also more common in people whose immune system is weakened because of a disease such as cancer, or by drugs such as steroids or chemotherapy. At least 1 million people a year in the [place] get shingles. 2. Shingles vaccine (recombinant) Recombinant shingles vaccine was approved by FDA in 2017 for the prevention  of shingles. In clinical trials, it was more than 90% effective in preventing shingles. It can also reduce the likelihood of PHN. Two doses, 2 to 6 months apart, are recommended for adults 82 and older. This vaccine is also recommended for people who have already gotten the live shingles vaccine (Zostavax). There is no live virus in this vaccine. 3. Some people should not get this vaccine Tell your vaccine provider if you:  Have any severe, life-threatening allergies. A person who has ever had a life-threatening allergic reaction after a dose of recombinant shingles vaccine, or has a severe allergy to any component of this vaccine, may be advised not  to be vaccinated. Ask your health care provider if you want information about vaccine components.  Are pregnant or breastfeeding. There is not much information about use of recombinant shingles vaccine in pregnant or nursing women. Your healthcare provider might recommend delaying vaccination.  Are not feeling well. If you have a mild illness, such as a cold, you can probably get the vaccine today. If you are moderately or severely ill, you should probably wait until you recover. Your doctor can advise you. 4. Risks of a vaccine reaction With any medicine, including vaccines, there is a chance of reactions. After recombinant shingles vaccination, a person might experience:  Pain, redness, soreness, or swelling at the site of the injection  Headache, muscle aches, fever, shivering, fatigue In clinical trials, most people got a sore arm with mild or moderate pain after vaccination, and some also had redness and swelling where they got the shot. Some people felt tired, had muscle pain, a headache, shivering, fever, stomach pain, or nausea. About 1 out of 6 people who got recombinant zoster vaccine experienced side effects that prevented them from doing regular activities. Symptoms went away on their own in about 2 to 3 days. Side effects were more common in younger people. You should still get the second dose of recombinant zoster vaccine even if you had one of these reactions after the first dose. Other things that could happen after this vaccine:  People sometimes faint after medical procedures, including vaccination. Sitting or lying down for about 15 minutes can help prevent fainting and injuries caused by a fall. Tell your provider if you feel dizzy or have vision changes or ringing in the ears.  Some people get shoulder pain that can be more severe and longer-lasting than routine soreness that can follow injections. This happens very rarely.  Any medication can cause a severe allergic  reaction. Such reactions to a vaccine are estimated at about 1 in a million doses, and would happen within a few minutes to a few hours after the vaccination. As with any medicine, there is a very remote chance of a vaccine causing a serious injury or death. The safety of vaccines is always being monitored. For more information, visit: http://www.aguilar.org/ 5. What if there is a serious problem? What should I look for?  Look for anything that concerns you, such as signs of a severe allergic reaction, very high fever, or unusual behavior. Signs of a severe allergic reaction can include hives, swelling of the face and throat, difficulty breathing, a fast heartbeat, dizziness, and weakness. These would usually start a few minutes to a few hours after the vaccination. What should I do?  If you think it is a severe allergic reaction or other emergency that can't wait, call 9-1-1 or get to the nearest hospital. Otherwise, call your health care provider. Afterward, the  reaction should be reported to the Vaccine Adverse Event Reporting System (VAERS). Your doctor should file this report, or you can do it yourself through the VAERS website at www.vaers.SamedayNews.es, or by calling 210-803-1379. VAERS does not give medical advice. 6. How can I learn more?  Ask your health care provider. He or she can give you the vaccine package insert or suggest other sources of information.  Call your local or state health department.  Contact the Centers for Disease Control and Prevention (CDC): ? Call 878-060-0041 (1-800-CDC-INFO) or ? Visit CDC's vaccines website at http://hunter.com/ CDC Vaccine Information Statement Recombinant Zoster Vaccine (09/23/2016) This information is not intended to replace advice given to you by your health care provider. Make sure you discuss any questions you have with your health care provider. Document Released: 10/08/2016 Document Revised: 03/04/2018 Document Reviewed:  03/04/2018 Elsevier Interactive Patient Education  2019 [employer].

## 2018-10-21 LAB — PSA: Prostate Specific Ag, Serum: 0.5 ng/mL (ref 0.0–4.0)

## 2018-10-21 LAB — CMP14+EGFR
ALT: 14 IU/L (ref 0–44)
AST: 27 IU/L (ref 0–40)
Albumin/Globulin Ratio: 2.2 (ref 1.2–2.2)
Albumin: 4.6 g/dL (ref 3.8–4.9)
Alkaline Phosphatase: 67 IU/L (ref 39–117)
BUN/Creatinine Ratio: 9 (ref 9–20)
BUN: 10 mg/dL (ref 6–24)
Bilirubin Total: 0.5 mg/dL (ref 0.0–1.2)
CO2: 19 mmol/L — ABNORMAL LOW (ref 20–29)
Calcium: 9.3 mg/dL (ref 8.7–10.2)
Chloride: 103 mmol/L (ref 96–106)
Creatinine, Ser: 1.09 mg/dL (ref 0.76–1.27)
GFR calc Af Amer: 90 mL/min/{1.73_m2} (ref >59)
GFR calc non Af Amer: 78 mL/min/{1.73_m2} (ref >59)
Globulin, Total: 2.1 g/dL (ref 1.5–4.5)
Glucose: 85 mg/dL (ref 65–99)
Potassium: 4.3 mmol/L (ref 3.5–5.2)
Sodium: 139 mmol/L (ref 134–144)
Total Protein: 6.7 g/dL (ref 6.0–8.5)

## 2018-10-21 LAB — LIPID PANEL
Chol/HDL Ratio: 2.9 ratio (ref 0.0–5.0)
Cholesterol, Total: 149 mg/dL (ref 100–199)
HDL: 51 mg/dL (ref >39)
LDL Calculated: 88 mg/dL (ref 0–99)
Triglycerides: 51 mg/dL (ref 0–149)
VLDL Cholesterol Cal: 10 mg/dL (ref 5–40)

## 2018-10-21 LAB — TSH: TSH: 3.38 u[IU]/mL (ref 0.450–4.500)

## 2018-11-30 ENCOUNTER — Other Ambulatory Visit: Payer: Self-pay | Admitting: Family Medicine

## 2018-11-30 DIAGNOSIS — J3089 Other allergic rhinitis: Secondary | ICD-10-CM

## 2018-11-30 MED ORDER — FLUTICASONE PROPIONATE 50 MCG/ACT NA SUSP: NASAL | 1 refills | Status: DC

## 2018-11-30 NOTE — Telephone Encounter (Signed)
Not on active med list but has been in office recently

## 2019-01-13 ENCOUNTER — Ambulatory Visit: Payer: Self-pay

## 2019-01-13 NOTE — Telephone Encounter (Signed)
Patient called and says his employer is wanting him tested for possible exposure to a co-worker who tested positive for COVID-19 on 01/08/19. He says that he last worked on 01/08/19 and he did not have direct contact, just pass by contact and they all use the same restroom and use the same break room. He denies any symptoms, denies any travel or known exposure to anyone with COVID. I called the office and spoke to [place], [facility] who asked to speak to the patient, the call was connected successfully.  Answer Assessment - Initial Assessment Questions 1. CLOSE CONTACT: "Who is the person with the confirmed or suspected COVID-19 infection that you were exposed to?"     Co-worker who tested positive on 01/08/19 2. PLACE of CONTACT: "Where were you when you were exposed to COVID-19?" (e.g., home, school, medical waiting room; which city?)     Job; [employer], [place] 3. TYPE of CONTACT: "How much contact was there?" (e.g., sitting next to, live in same house, work in same office, same building)      Same building 4. DURATION of CONTACT: "How long were you in contact with the COVID-19 patient?" (e.g., a few seconds, passed by person, a few minutes, live with the patient)     Passing by, use the same restroom, break room 5. DATE of CONTACT: "When did you have contact with a COVID-19 patient?" (e.g., how many days ago)     01/08/19 6. TRAVEL: "Have you traveled out of the country recently?" If so, "When and where?"     * Also ask about out-of-state travel, since the CDC has identified some high-risk cities for community spread in the [place].     * Note: Travel becomes less relevant if there is widespread community transmission where the patient lives.     No 7. COMMUNITY SPREAD: "Are there lots of cases of COVID-19 (community spread) where you live?" (See public health department website, if unsure)       No 8. SYMPTOMS: "Do you have any symptoms?" (e.g., fever, cough, breathing difficulty)     No 9. PREGNANCY OR  POSTPARTUM: "Is there any chance you are pregnant?" "When was your last menstrual period?" "Did you deliver in the last 2 weeks?"     N/A 10. HIGH RISK: "Do you have any heart or lung problems? Do you have a weak immune system?" (e.g., CHF, COPD, asthma, HIV positive, chemotherapy, renal failure, diabetes mellitus, sickle cell anemia)      No  Protocols used: CORONAVIRUS (COVID-19) EXPOSURE-A-AH

## 2019-01-13 NOTE — Telephone Encounter (Signed)
Left a msg on machine for patient to give our office a call so he can schedule an tele-med appt to see Dr. Reesa Chew can not just send him in a note. He need an appt

## 2019-01-15 ENCOUNTER — Telehealth: Payer: Managed Care, Other (non HMO) | Admitting: Family Medicine

## 2019-05-05 ENCOUNTER — Other Ambulatory Visit: Payer: Self-pay

## 2019-05-05 ENCOUNTER — Ambulatory Visit (INDEPENDENT_AMBULATORY_CARE_PROVIDER_SITE_OTHER): Payer: Managed Care, Other (non HMO) | Admitting: Emergency Medicine

## 2019-05-05 DIAGNOSIS — Z23 Encounter for immunization: Secondary | ICD-10-CM | POA: Diagnosis not present

## 2019-09-20 ENCOUNTER — Ambulatory Visit: Payer: PRIVATE HEALTH INSURANCE | Attending: Internal Medicine

## 2019-09-20 DIAGNOSIS — Z20822 Contact with and (suspected) exposure to covid-19: Secondary | ICD-10-CM | POA: Insufficient documentation

## 2019-09-21 LAB — NOVEL CORONAVIRUS, NAA: SARS-CoV-2, NAA: NOT DETECTED

## 2019-10-13 ENCOUNTER — Ambulatory Visit: Payer: PRIVATE HEALTH INSURANCE | Attending: Internal Medicine

## 2019-10-13 DIAGNOSIS — Z20822 Contact with and (suspected) exposure to covid-19: Secondary | ICD-10-CM | POA: Insufficient documentation

## 2019-10-14 LAB — NOVEL CORONAVIRUS, NAA: SARS-CoV-2, NAA: NOT DETECTED

## 2019-12-12 ENCOUNTER — Other Ambulatory Visit: Payer: Self-pay | Admitting: Family Medicine

## 2019-12-12 DIAGNOSIS — J3089 Other allergic rhinitis: Secondary | ICD-10-CM

## 2019-12-12 NOTE — Telephone Encounter (Signed)
Requested medications are due for refill today? Yes  Requested medications are on active medication list?  Yes  Last Refill:   11/30/2018  # 16 g with one refill   Future visit scheduled?  No   Notes to Clinic:  Medication failed RX refill protocol due to no valid encounter in the last 12 months.  Patient's last visit with provider was March 2020.

## 2020-02-25 ENCOUNTER — Encounter: Payer: Managed Care, Other (non HMO) | Admitting: Family Medicine

## 2020-03-13 ENCOUNTER — Other Ambulatory Visit: Payer: Self-pay

## 2020-03-13 ENCOUNTER — Other Ambulatory Visit: Payer: Self-pay | Admitting: Family Medicine

## 2020-03-13 ENCOUNTER — Ambulatory Visit: Payer: Self-pay

## 2020-03-13 DIAGNOSIS — M79644 Pain in right finger(s): Secondary | ICD-10-CM

## 2020-04-18 ENCOUNTER — Ambulatory Visit (INDEPENDENT_AMBULATORY_CARE_PROVIDER_SITE_OTHER): Payer: Managed Care, Other (non HMO) | Admitting: Emergency Medicine

## 2020-04-18 ENCOUNTER — Other Ambulatory Visit: Payer: Self-pay

## 2020-04-18 DIAGNOSIS — Z0000 Encounter for general adult medical examination without abnormal findings: Secondary | ICD-10-CM | POA: Diagnosis not present

## 2020-04-18 NOTE — Patient Instructions (Signed)
° ° ° °  If you have lab work done today you will be contacted with your lab results within the next 2 weeks.  If you have not heard from us then please contact us. The fastest way to get your results is to register for My Chart. ° ° °IF you received an x-ray today, you will receive an invoice from [facility]. Please contact [facility] at [phone] with questions or concerns regarding your invoice.  ° °IF you received labwork today, you will receive an invoice from [facility]. Please contact [facility] at [phone] with questions or concerns regarding your invoice.  ° °Our billing staff will not be able to assist you with questions regarding bills from these companies. ° °You will be contacted with the lab results as soon as they are available. The fastest way to get your results is to activate your My Chart account. Instructions are located on the last page of this paperwork. If you have not heard from us regarding the results in 2 weeks, please contact this office. °  ° ° ° °

## 2020-04-19 LAB — CMP14+EGFR
ALT: 14 IU/L (ref 0–44)
AST: 21 IU/L (ref 0–40)
Albumin/Globulin Ratio: 1.9 (ref 1.2–2.2)
Albumin: 4.4 g/dL (ref 3.8–4.9)
Alkaline Phosphatase: 69 IU/L (ref 48–121)
BUN/Creatinine Ratio: 11 (ref 9–20)
BUN: 12 mg/dL (ref 6–24)
Bilirubin Total: 0.4 mg/dL (ref 0.0–1.2)
CO2: 20 mmol/L (ref 20–29)
Calcium: 9.1 mg/dL (ref 8.7–10.2)
Chloride: 104 mmol/L (ref 96–106)
Creatinine, Ser: 1.07 mg/dL (ref 0.76–1.27)
GFR calc Af Amer: 92 mL/min/{1.73_m2} (ref >59)
GFR calc non Af Amer: 79 mL/min/{1.73_m2} (ref >59)
Globulin, Total: 2.3 g/dL (ref 1.5–4.5)
Glucose: 95 mg/dL (ref 65–99)
Potassium: 3.8 mmol/L (ref 3.5–5.2)
Sodium: 139 mmol/L (ref 134–144)
Total Protein: 6.7 g/dL (ref 6.0–8.5)

## 2020-04-19 LAB — CBC WITH DIFFERENTIAL/PLATELET
Basophils Absolute: 0 10*3/uL (ref 0.0–0.2)
Basos: 1 %
EOS (ABSOLUTE): 0.2 10*3/uL (ref 0.0–0.4)
Eos: 4 %
Hematocrit: 44.4 % (ref 37.5–51.0)
Hemoglobin: 13.7 g/dL (ref 13.0–17.7)
Immature Grans (Abs): 0 10*3/uL (ref 0.0–0.1)
Immature Granulocytes: 0 %
Lymphocytes Absolute: 1.8 10*3/uL (ref 0.7–3.1)
Lymphs: 44 %
MCH: 23.8 pg — ABNORMAL LOW (ref 26.6–33.0)
MCHC: 30.9 g/dL — ABNORMAL LOW (ref 31.5–35.7)
MCV: 77 fL — ABNORMAL LOW (ref 79–97)
Monocytes Absolute: 0.3 10*3/uL (ref 0.1–0.9)
Monocytes: 8 %
Neutrophils Absolute: 1.8 10*3/uL (ref 1.4–7.0)
Neutrophils: 43 %
Platelets: 203 10*3/uL (ref 150–450)
RBC: 5.75 x10E6/uL (ref 4.14–5.80)
RDW: 14.5 % (ref 11.6–15.4)
WBC: 4.2 10*3/uL (ref 3.4–10.8)

## 2020-04-19 LAB — LIPID PANEL
Chol/HDL Ratio: 3.8 ratio (ref 0.0–5.0)
Cholesterol, Total: 169 mg/dL (ref 100–199)
HDL: 44 mg/dL (ref >39)
LDL Chol Calc (NIH): 110 mg/dL — ABNORMAL HIGH (ref 0–99)
Triglycerides: 79 mg/dL (ref 0–149)
VLDL Cholesterol Cal: 15 mg/dL (ref 5–40)

## 2020-04-19 LAB — TSH: TSH: 4.12 u[IU]/mL (ref 0.450–4.500)

## 2020-04-24 ENCOUNTER — Ambulatory Visit (INDEPENDENT_AMBULATORY_CARE_PROVIDER_SITE_OTHER): Payer: Managed Care, Other (non HMO) | Admitting: Emergency Medicine

## 2020-04-24 ENCOUNTER — Other Ambulatory Visit: Payer: Self-pay

## 2020-04-24 ENCOUNTER — Encounter: Payer: Self-pay | Admitting: Emergency Medicine

## 2020-04-24 VITALS — BP 104/70 | HR 69 | Temp 97.7°F | Resp 16 | Ht 67.5 in | Wt 163.0 lb

## 2020-04-24 DIAGNOSIS — Z23 Encounter for immunization: Secondary | ICD-10-CM

## 2020-04-24 DIAGNOSIS — Z0000 Encounter for general adult medical examination without abnormal findings: Secondary | ICD-10-CM

## 2020-04-24 NOTE — Patient Instructions (Addendum)
   If you have lab work done today you will be contacted with your lab results within the next 2 weeks.  If you have not heard from us then please contact us. The fastest way to get your results is to register for My Chart.   IF you received an x-ray today, you will receive an invoice from [facility]. Please contact [facility] at [phone] with questions or concerns regarding your invoice.   IF you received labwork today, you will receive an invoice from [facility]. Please contact [facility] at [phone] with questions or concerns regarding your invoice.   Our billing staff will not be able to assist you with questions regarding bills from these companies.  You will be contacted with the lab results as soon as they are available. The fastest way to get your results is to activate your My Chart account. Instructions are located on the last page of this paperwork. If you have not heard from us regarding the results in 2 weeks, please contact this office.      Health Maintenance, Male Adopting a healthy lifestyle and getting preventive care are important in promoting health and wellness. Ask your health care provider about:  The right schedule for you to have regular tests and exams.  Things you can do on your own to prevent diseases and keep yourself healthy. What should I know about diet, weight, and exercise? Eat a healthy diet   Eat a diet that includes plenty of vegetables, fruits, low-fat dairy products, and lean protein.  Do not eat a lot of foods that are high in solid fats, added sugars, or sodium. Maintain a healthy weight Body mass index (BMI) is a measurement that can be used to identify possible weight problems. It estimates body fat based on height and weight. Your health care provider can help determine your BMI and help you achieve or maintain a healthy weight. Get regular exercise Get regular exercise. This is one of the most important things you  can do for your health. Most adults should:  Exercise for at least 150 minutes each week. The exercise should increase your heart rate and make you sweat (moderate-intensity exercise).  Do strengthening exercises at least twice a week. This is in addition to the moderate-intensity exercise.  Spend less time sitting. Even light physical activity can be beneficial. Watch cholesterol and blood lipids Have your blood tested for lipids and cholesterol at 52 years of age, then have this test every 5 years. You may need to have your cholesterol levels checked more often if:  Your lipid or cholesterol levels are high.  You are older than 52 years of age.  You are at high risk for heart disease. What should I know about cancer screening? Many types of cancers can be detected early and may often be prevented. Depending on your health history and family history, you may need to have cancer screening at various ages. This may include screening for:  Colorectal cancer.  Prostate cancer.  Skin cancer.  Lung cancer. What should I know about heart disease, diabetes, and high blood pressure? Blood pressure and heart disease  High blood pressure causes heart disease and increases the risk of stroke. This is more likely to develop in people who have high blood pressure readings, are of African descent, or are overweight.  Talk with your health care provider about your target blood pressure readings.  Have your blood pressure checked: ? Every 3-5 years if you are 18-39   years of age. ? Every year if you are 40 years old or older.  If you are between the ages of 65 and 75 and are a current or former smoker, ask your health care provider if you should have a one-time screening for abdominal aortic aneurysm (AAA). Diabetes Have regular diabetes screenings. This checks your fasting blood sugar level. Have the screening done:  Once every three years after age 45 if you are at a normal weight and have  a low risk for diabetes.  More often and at a younger age if you are overweight or have a high risk for diabetes. What should I know about preventing infection? Hepatitis B If you have a higher risk for hepatitis B, you should be screened for this virus. Talk with your health care provider to find out if you are at risk for hepatitis B infection. Hepatitis C Blood testing is recommended for:  Everyone born from 1945 through 1965.  Anyone with known risk factors for hepatitis C. Sexually transmitted infections (STIs)  You should be screened each year for STIs, including gonorrhea and chlamydia, if: ? You are sexually active and are younger than 52 years of age. ? You are older than 52 years of age and your health care provider tells you that you are at risk for this type of infection. ? Your sexual activity has changed since you were last screened, and you are at increased risk for chlamydia or gonorrhea. Ask your health care provider if you are at risk.  Ask your health care provider about whether you are at high risk for HIV. Your health care provider may recommend a prescription medicine to help prevent HIV infection. If you choose to take medicine to prevent HIV, you should first get tested for HIV. You should then be tested every 3 months for as long as you are taking the medicine. Follow these instructions at home: Lifestyle  Do not use any products that contain nicotine or tobacco, such as cigarettes, e-cigarettes, and chewing tobacco. If you need help quitting, ask your health care provider.  Do not use street drugs.  Do not share needles.  Ask your health care provider for help if you need support or information about quitting drugs. Alcohol use  Do not drink alcohol if your health care provider tells you not to drink.  If you drink alcohol: ? Limit how much you have to 0-2 drinks a day. ? Be aware of how much alcohol is in your drink. In the [place] one drink equals one 12  oz bottle of beer (355 mL), one 5 oz glass of wine (148 mL), or one 1 oz glass of hard liquor (44 mL). General instructions  Schedule regular health, dental, and eye exams.  Stay current with your vaccines.  Tell your health care provider if: ? You often feel depressed. ? You have ever been abused or do not feel safe at home. Summary  Adopting a healthy lifestyle and getting preventive care are important in promoting health and wellness.  Follow your health care provider's instructions about healthy diet, exercising, and getting tested or screened for diseases.  Follow your health care provider's instructions on monitoring your cholesterol and blood pressure. This information is not intended to replace advice given to you by your health care provider. Make sure you discuss any questions you have with your health care provider. Document Revised: 07/22/2018 Document Reviewed: 07/22/2018 Elsevier Patient Education  2020 Elsevier Inc.  

## 2020-04-24 NOTE — Progress Notes (Signed)
Tyrone Hendricks 54 y.o.   Chief Complaint  Patient presents with   Annual Exam    HISTORY OF PRESENT ILLNESS: This is a 54 y.o. male here for annual exam. No chronic medical problems.  No chronic medications. Non-smoker no EtOH user. Fully vaccinated against Covid. Has no complaints or medical concerns today. Blood work done on 04/18/2020: Normal CBC, CMP, TSH, and lipid profile.  No concerns identified. The 10-year ASCVD risk score Denman George DC Montez Hageman., et al., 2013) is: 2.7%   Values used to calculate the score:     Age: 43 years     Sex: Male     Is Non-Hispanic African American: No     Diabetic: No     Tobacco smoker: No     Systolic Blood Pressure: 104 mmHg     Is BP treated: No     HDL Cholesterol: 44 mg/dL     Total Cholesterol: 169 mg/dL   HPI   Prior to Admission medications   Medication Sig Start Date End Date Taking? Authorizing Provider  fluticasone (FLONASE) 50 MCG/ACT nasal spray USE 2 SPRAYS IN EACH NOSTRIL DAILY AS DIRECTED 12/13/19  Yes Myles Lipps, MD    Allergies  Allergen Reactions   Other Other (See Comments)    sneezing    Patient Active Problem List   Diagnosis Date Noted   Constipation, chronic 07/13/2012   Depression 09/21/2011    Past Medical History:  Diagnosis Date   Allergy    Anxiety    Depression    Hemorrhoids    Unspecified constipation     Past Surgical History:  Procedure Laterality Date   unremarkable      Social History   Socioeconomic History   Marital status: Married    Spouse name: Not on file   Number of children: Not on file   Years of education: Not on file   Highest education level: Not on file  Occupational History   Not on file  Tobacco Use   Smoking status: Former Smoker    Quit date: 11/20/2006    Years since quitting: 13.4   Smokeless tobacco: Never Used  Substance and Sexual Activity   Alcohol use: No   Drug use: No   Sexual activity: Yes  Other Topics Concern   Not on file    Social History Narrative   Pt from [place].  Came to [place] in 2001.       Social Determinants of Health   Financial Resource Strain:    Difficulty of Paying Living Expenses: Not on file  Food Insecurity:    Worried About Programme researcher, broadcasting/film/video in the Last Year: Not on file   [employer] of Food in the Last Year: Not on file  Transportation Needs:    Lack of Transportation (Medical): Not on file   Lack of Transportation (Non-Medical): Not on file  Physical Activity:    Days of Exercise per Week: Not on file   Minutes of Exercise per Session: Not on file  Stress:    Feeling of Stress : Not on file  Social Connections:    Frequency of Communication with Friends and Family: Not on file   Frequency of Social Gatherings with Friends and Family: Not on file   Attends Religious Services: Not on file   Active Member of Clubs or Organizations: Not on file   Attends Banker Meetings: Not on file   Marital Status: Not on file  Intimate Partner  Violence:    Fear of Current or Ex-Partner: Not on file   Emotionally Abused: Not on file   Physically Abused: Not on file   Sexually Abused: Not on file    Family History  Problem Relation Age of Onset   Stomach cancer Father    Colon cancer Neg Hx    Colon polyps Neg Hx      Review of Systems  Constitutional: Negative.  Negative for chills and fever.  HENT: Negative.  Negative for congestion and sore throat.   Respiratory: Negative.  Negative for cough and shortness of breath.   Cardiovascular: Negative.  Negative for chest pain and palpitations.  Gastrointestinal: Negative.  Negative for abdominal pain, blood in stool, diarrhea, melena, nausea and vomiting.  Genitourinary: Negative.  Negative for hematuria.  Musculoskeletal: Positive for joint pain (Intermittent pain to left knee).  Skin: Negative.  Negative for rash.  Neurological: Negative.  Negative for dizziness and headaches.  Endo/Heme/Allergies:  Negative.   All other systems reviewed and are negative.  Vitals:   04/24/20 1424  BP: 104/70  Pulse: 69  Resp: 16  Temp: 97.7 F (36.5 C)  SpO2: 95%     Physical Exam Vitals reviewed.  Constitutional:      Appearance: Normal appearance.  HENT:     Head: Normocephalic.     Mouth/Throat:     Mouth: Mucous membranes are moist.     Pharynx: Oropharynx is clear.  Eyes:     Extraocular Movements: Extraocular movements intact.     Conjunctiva/sclera: Conjunctivae normal.     Pupils: Pupils are equal, round, and reactive to light.  Neck:     Vascular: No carotid bruit.  Cardiovascular:     Rate and Rhythm: Normal rate and regular rhythm.     Pulses: Normal pulses.     Heart sounds: Normal heart sounds.  Pulmonary:     Effort: Pulmonary effort is normal.     Breath sounds: Normal breath sounds.  Abdominal:     General: Bowel sounds are normal. There is no distension.     Palpations: Abdomen is soft.     Tenderness: There is no abdominal tenderness.  Musculoskeletal:        General: Normal range of motion.     Cervical back: Normal range of motion and neck supple. No tenderness.     Right lower leg: No edema.     Left lower leg: No edema.     Comments: Left knee: No swelling or erythema.  No localized tenderness.  Full range of motion. Rest of lower extremities: Within normal limits  Lymphadenopathy:     Cervical: No cervical adenopathy.  Skin:    General: Skin is warm and dry.     Capillary Refill: Capillary refill takes less than 2 seconds.  Neurological:     General: No focal deficit present.     Mental Status: He is alert and oriented to person, place, and time.  Psychiatric:        Mood and Affect: Mood normal.        Behavior: Behavior normal.      ASSESSMENT & PLAN: Tyrone Hendricks was seen today for annual exam.  Diagnoses and all orders for this visit:  Need for prophylactic vaccination and inoculation against influenza -     Flu Vaccine QUAD 36+ mos  IM  Routine general medical examination at a health care facility    Patient Instructions       If you have lab  work done today you will be contacted with your lab results within the next 2 weeks.  If you have not heard from [place] then please contact us. The fastest way to get your results is to register for My Chart.   IF you received an x-ray today, you will receive an invoice from [facility]. Please contact [facility] at [phone] with questions or concerns regarding your invoice.   IF you received labwork today, you will receive an invoice from [facility]. Please contact [facility] at [phone] with questions or concerns regarding your invoice.   Our billing staff will not be able to assist you with questions regarding bills from these companies.  You will be contacted with the lab results as soon as they are available. The fastest way to get your results is to activate your My Chart account. Instructions are located on the last page of this paperwork. If you have not heard from [place] regarding the results in 2 weeks, please contact this office.      Health Maintenance, Male Adopting a healthy lifestyle and getting preventive care are important in promoting health and wellness. Ask your health care provider about:  The right schedule for you to have regular tests and exams.  Things you can do on your own to prevent diseases and keep yourself healthy. What should I know about diet, weight, and exercise? Eat a healthy diet   Eat a diet that includes plenty of vegetables, fruits, low-fat dairy products, and lean protein.  Do not eat a lot of foods that are high in solid fats, added sugars, or sodium. Maintain a healthy weight Body mass index (BMI) is a measurement that can be used to identify possible weight problems. It estimates body fat based on height and weight. Your health care provider can help determine your BMI and help you achieve or maintain a  healthy weight. Get regular exercise Get regular exercise. This is one of the most important things you can do for your health. Most adults should:  Exercise for at least 150 minutes each week. The exercise should increase your heart rate and make you sweat (moderate-intensity exercise).  Do strengthening exercises at least twice a week. This is in addition to the moderate-intensity exercise.  Spend less time sitting. Even light physical activity can be beneficial. Watch cholesterol and blood lipids Have your blood tested for lipids and cholesterol at 52 years of age, then have this test every 5 years. You may need to have your cholesterol levels checked more often if:  Your lipid or cholesterol levels are high.  You are older than 52 years of age.  You are at high risk for heart disease. What should I know about cancer screening? Many types of cancers can be detected early and may often be prevented. Depending on your health history and family history, you may need to have cancer screening at various ages. This may include screening for:  Colorectal cancer.  Prostate cancer.  Skin cancer.  Lung cancer. What should I know about heart disease, diabetes, and high blood pressure? Blood pressure and heart disease  High blood pressure causes heart disease and increases the risk of stroke. This is more likely to develop in people who have high blood pressure readings, are of African descent, or are overweight.  Talk with your health care provider about your target blood pressure readings.  Have your blood pressure checked: ? Every 3-5 years if you are 52-78 years of age. ? Every year if  you are 9 years old or older.  If you are between the ages of 35 and 66 and are a current or former smoker, ask your health care provider if you should have a one-time screening for abdominal aortic aneurysm (AAA). Diabetes Have regular diabetes screenings. This checks your fasting blood sugar  level. Have the screening done:  Once every three years after age 3 if you are at a normal weight and have a low risk for diabetes.  More often and at a younger age if you are overweight or have a high risk for diabetes. What should I know about preventing infection? Hepatitis B If you have a higher risk for hepatitis B, you should be screened for this virus. Talk with your health care provider to find out if you are at risk for hepatitis B infection. Hepatitis C Blood testing is recommended for:  Everyone born from [birth year] through 1963.  Anyone with known risk factors for hepatitis C. Sexually transmitted infections (STIs)  You should be screened each year for STIs, including gonorrhea and chlamydia, if: ? You are sexually active and are younger than 52 years of age. ? You are older than 52 years of age and your health care provider tells you that you are at risk for this type of infection. ? Your sexual activity has changed since you were last screened, and you are at increased risk for chlamydia or gonorrhea. Ask your health care provider if you are at risk.  Ask your health care provider about whether you are at high risk for HIV. Your health care provider may recommend a prescription medicine to help prevent HIV infection. If you choose to take medicine to prevent HIV, you should first get tested for HIV. You should then be tested every 3 months for as long as you are taking the medicine. Follow these instructions at home: Lifestyle  Do not use any products that contain nicotine or tobacco, such as cigarettes, e-cigarettes, and chewing tobacco. If you need help quitting, ask your health care provider.  Do not use street drugs.  Do not share needles.  Ask your health care provider for help if you need support or information about quitting drugs. Alcohol use  Do not drink alcohol if your health care provider tells you not to drink.  If you drink alcohol: ? Limit how much you  have to 0-2 drinks a day. ? Be aware of how much alcohol is in your drink. In the [place] one drink equals one 12 oz bottle of beer (355 mL), one 5 oz glass of wine (148 mL), or one 1 oz glass of hard liquor (44 mL). General instructions  Schedule regular health, dental, and eye exams.  Stay current with your vaccines.  Tell your health care provider if: ? You often feel depressed. ? You have ever been abused or do not feel safe at home. Summary  Adopting a healthy lifestyle and getting preventive care are important in promoting health and wellness.  Follow your health care provider's instructions about healthy diet, exercising, and getting tested or screened for diseases.  Follow your health care provider's instructions on monitoring your cholesterol and blood pressure. This information is not intended to replace advice given to you by your health care provider. Make sure you discuss any questions you have with your health care provider. Document Revised: 07/22/2018 Document Reviewed: 07/22/2018 Elsevier Patient Education  2020 [employer]      Edwina Barth, MD [facility]  [place]

## 2021-02-20 ENCOUNTER — Other Ambulatory Visit: Payer: Self-pay

## 2021-02-20 ENCOUNTER — Ambulatory Visit (INDEPENDENT_AMBULATORY_CARE_PROVIDER_SITE_OTHER): Payer: Managed Care, Other (non HMO) | Admitting: Emergency Medicine

## 2021-02-20 ENCOUNTER — Encounter: Payer: Self-pay | Admitting: Emergency Medicine

## 2021-02-20 VITALS — BP 110/78 | HR 78 | Temp 97.8°F | Ht 67.5 in | Wt 166.0 lb

## 2021-02-20 DIAGNOSIS — F4010 Social phobia, unspecified: Secondary | ICD-10-CM

## 2021-02-20 DIAGNOSIS — J3089 Other allergic rhinitis: Secondary | ICD-10-CM

## 2021-02-20 MED ORDER — FLUTICASONE PROPIONATE 50 MCG/ACT NA SUSP: NASAL | 1 refills | Status: DC

## 2021-02-20 MED ORDER — ESCITALOPRAM OXALATE 10 MG PO TABS: 10.0000 mg | ORAL_TABLET | Freq: Every day | ORAL | 1 refills | Status: DC

## 2021-02-20 MED ORDER — FLUTICASONE PROPIONATE 50 MCG/ACT NA SUSP
2.0000 | Freq: Every day | NASAL | 6 refills | Status: DC
Start: 2021-02-20 — End: 2021-05-30

## 2021-02-20 NOTE — Progress Notes (Signed)
Tyrone Hendricks 55 y.o.   Chief Complaint  Patient presents with   Anxiety    HISTORY OF PRESENT ILLNESS: This is a 55 y.o. male complaining of social anxiety. Was treated for about 10 years ago.  Took medication for couple years with no significant improvement.  Does not remember the name of medication.  Review of old medical records reveal it was Wellbutrin XL.  No recent psychiatric evaluation. No other complaints or medical concerns today.  Anxiety Symptoms include nervous/anxious behavior. Patient reports no chest pain, dizziness, nausea, palpitations or shortness of breath.      Prior to Admission medications   Medication Sig Start Date End Date Taking? Authorizing Provider  fluticasone (FLONASE) 50 MCG/ACT nasal spray USE 2 SPRAYS IN EACH NOSTRIL DAILY AS DIRECTED 12/13/19  Yes Lezlie Lye, Meda Coffee, MD    Allergies  Allergen Reactions   Other Other (See Comments)    sneezing    Patient Active Problem List   Diagnosis Date Noted   Constipation, chronic 07/13/2012   Depression 09/21/2011    Past Medical History:  Diagnosis Date   Allergy    Anxiety    Depression    Hemorrhoids    Unspecified constipation     Past Surgical History:  Procedure Laterality Date   unremarkable      Social History   Socioeconomic History   Marital status: Married    Spouse name: Not on file   Number of children: Not on file   Years of education: Not on file   Highest education level: Not on file  Occupational History   Not on file  Tobacco Use   Smoking status: Former    Pack years: 0.00    Types: Cigarettes    Quit date: 11/20/2006    Years since quitting: 14.2   Smokeless tobacco: Never  Substance and Sexual Activity   Alcohol use: No   Drug use: No   Sexual activity: Yes  Other Topics Concern   Not on file  Social History Narrative   Pt from [place].  Came to [place] in 2001.       Social Determinants of Health   Financial Resource Strain: Not on file  Food  Insecurity: Not on file  Transportation Needs: Not on file  Physical Activity: Not on file  Stress: Not on file  Social Connections: Not on file  Intimate Partner Violence: Not on file    Family History  Problem Relation Age of Onset   Stomach cancer Father    Colon cancer Neg Hx    Colon polyps Neg Hx      Review of Systems  Constitutional: Negative.  Negative for chills and fever.  HENT: Negative.  Negative for congestion and sore throat.   Respiratory: Negative.  Negative for cough and shortness of breath.   Cardiovascular: Negative.  Negative for chest pain and palpitations.  Gastrointestinal:  Negative for abdominal pain, diarrhea, nausea and vomiting.  Genitourinary: Negative.  Negative for dysuria.  Skin: Negative.  Negative for rash.  Neurological:  Negative for dizziness and headaches.  Psychiatric/Behavioral:  The patient is nervous/anxious.   All other systems reviewed and are negative.  Today's Vitals   02/20/21 1528  BP: 110/78  Pulse: 78  Temp: 97.8 F (36.6 C)  TempSrc: Oral  SpO2: 96%  Weight: 166 lb (75.3 kg)  Height: 5' 7.5" (1.715 m)   Body mass index is 25.62 kg/m.  Physical Exam Vitals reviewed.  Constitutional:  Appearance: Normal appearance.  HENT:     Head: Normocephalic.  Eyes:     Extraocular Movements: Extraocular movements intact.     Conjunctiva/sclera: Conjunctivae normal.     Pupils: Pupils are equal, round, and reactive to light.  Cardiovascular:     Rate and Rhythm: Normal rate and regular rhythm.     Pulses: Normal pulses.     Heart sounds: Normal heart sounds.  Pulmonary:     Effort: Pulmonary effort is normal.     Breath sounds: Normal breath sounds.  Abdominal:     General: Bowel sounds are normal. There is no distension.     Palpations: Abdomen is soft.     Tenderness: There is no abdominal tenderness.  Musculoskeletal:        General: Normal range of motion.     Cervical back: Normal range of motion and neck  supple.  Skin:    General: Skin is warm and dry.     Capillary Refill: Capillary refill takes less than 2 seconds.  Neurological:     General: No focal deficit present.     Mental Status: He is alert and oriented to person, place, and time.  Psychiatric:        Mood and Affect: Mood normal.        Behavior: Behavior normal.     ASSESSMENT & PLAN: A total of 30 minutes was spent with the patient and counseling/coordination of care regarding preparing for this visit, review of most recent office visit notes, review of most recent blood work results, diagnosis of social anxiety disorder and stress management mechanisms, recommendation to start medication Lexapro 10 mg daily, need for psychiatric evaluation, education on nutrition, prognosis, documentation and need for follow-up  Social anxiety disorder Currently active.  Coping mechanisms to deal with anxiety discussed with patient.  We will start Lexapro 10 mg daily and refer to psychiatrist for further evaluation.  No red flag signs or symptoms.  [name] was seen today for anxiety.  Diagnoses and all orders for this visit:  Social anxiety disorder -     escitalopram (LEXAPRO) 10 MG tablet; Take 1 tablet (10 mg total) by mouth daily. -     Ambulatory referral to Psychiatry  Non-seasonal allergic rhinitis, unspecified trigger -     fluticasone (FLONASE) 50 MCG/ACT nasal spray; USE 2 SPRAYS IN EACH NOSTRIL DAILY AS DIRECTED  Other orders -     fluticasone (FLONASE) 50 MCG/ACT nasal spray; Place 2 sprays into both nostrils daily.  Patient Instructions  Social Anxiety Disorder, Adult Social anxiety disorder (SAD), previously called social phobia, is a mental health condition. People with SAD often feel nervous, afraid, or embarrassed when they are around other people in social situations. They worry that other people are judging or criticizing them for how they look, what they say, or howthey act. SAD involves more than just feeling shy or  self-conscious at times. It can cause severe emotional distress. It can interfere with activities of dailylife. SAD also may lead to alcohol or drug use, and even suicide. SAD is a common mental health condition. It can develop at any time, but itusually starts in the teenage years. What are the causes? The cause of this condition is not known. It may involve genes that are passed through families. Stressful events may trigger anxiety. This disorder is also associated with an overactive amygdala. The amygdala is the part of the brainthat triggers your response to strong feelings, such as fear. What increases  the risk? This condition is more likely to develop in: People who have a family history of anxiety disorders. Women. People who have a physical or behavioral condition that makes them feel self-conscious or nervous, such as a stutter or a long-term (chronic) disease. What are the signs or symptoms? The main symptom of this condition is fear of embarrassment caused by being criticized or judged in social situations. You may be afraid to: Speak in public. Go shopping. Use a public bathroom. Eat at a restaurant. Go to work. Interact with people you do not know. Extreme fear and anxiety may cause physical symptoms, including: Blushing. A fast heartbeat. Sweating. Shaky hands or voice. Confusion. Light-headedness. Upset stomach, diarrhea, or vomiting. Shortness of breath. How is this diagnosed? This condition is diagnosed based on your history, symptoms, and behavior in social situations. You may be diagnosed with this type of anxiety if your symptoms have lasted for more than 6 months and have been present on more daysthan not. Your health care provider may ask you about your use of alcohol, drugs, and prescription medicines. He or she may also refer you to a mental healthspecialist for further evaluation or treatment. How is this treated? Treatment for this condition may  include: Cognitive behavioral therapy (CBT). This type of talk therapy helps you learn to replace negative thoughts and behaviors with positive ones. This may include learning how to use self-calming skills and other methods of managing your anxiety. Exposure therapy. You will be exposed to social situations that cause you fear. The treatment starts with practicing self-calming in situations that cause you low levels of fear. Over time, you will progress by sustaining self-calming and managing harder situations. Antidepressant medicines. These medicines may be used by themselves or in addition to other therapies. Biofeedback. This process trains you to manage your body's response (physiological response) through breathing techniques and relaxation methods. You will work with a therapist while machines are used to monitor your physical symptoms. Techniques for relaxation and managing anxiety. These include deep breathing, self-talk, meditation, visual imagery, muscle relaxation, music therapy, and yoga. These techniques are often used with other therapies to keep you calm in situations that cause you anxiety. These treatments are often used in combination. Follow these instructions at home: Alcohol use If you drink alcohol: Limit how much you use to: 0-1 drink a day for nonpregnant women. 0-2 drinks a day for men. Be aware of how much alcohol is in your drink. In the [place] one drink equals one 12 oz bottle of beer (355 mL), one 5 oz glass of wine (148 mL), or one 1 oz glass of hard liquor (44 mL). General instructions Take over-the-counter and prescription medicines only as told by your health care provider. Practice techniques for relaxation and managing anxiety at times you are not challenged by social anxiety. Return to social activities using techniques you have learned, as you feel ready to do so. Avoid caffeine and certain over-the-counter cold medicines. These may make you feel worse. Ask  your pharmacists which medicines to avoid. Keep all follow-up visits as told by your health care provider. This is important. Where to find more information [employer] on Mental Illness (NAMI): https://www.nami.org Social Anxiety Association: https://socialphobia.org Mental Health America [facility]): https://www.stevens-henderson.com/ Anxiety and Depression Association of [place] (ADAA): http://miller-hamilton.net/ Contact a health care provider if: Your symptoms do not improve or get worse. You have signs of depression, such as: Persistent sadness or moodiness. Loss of enjoyment in activities that used to bring  you joy. Change in weight or eating. Changes in sleeping habits. Avoiding friends or family members more than usual. Loss of energy for normal tasks. Feeling guilty or worthless. You become more isolated than you normally are. You find it more and more difficult to speak or interact with others. You are using drugs. You are drinking more alcohol than usual. Get help right away if: You harm yourself. You have suicidal thoughts. If you ever feel like you may hurt yourself or others, or have thoughts about taking your own life, get help right away. You can go to your nearest emergency department or call: Your local emergency services (911 in the [place] A suicide crisis helpline, such as the National Suicide Prevention Lifeline at 50213450721-303-108-0273. This is open 24 hours a day. Summary Social anxiety disorder (SAD) may cause you to feel nervous, afraid, or embarrassed when you are around other people in social situations. SAD is a common mental disorder. It can develop at any time, but it usually starts in the teenage years. Treatment includes talk therapy, exposure therapy, medicines, biofeedback, and relaxation techniques. It can involve a combination of treatments. This information is not intended to replace advice given to you by your health care provider. Make sure you discuss any questions you  have with your healthcare provider. Document Revised: 12/30/2018 Document Reviewed: 12/30/2018 Elsevier Patient Education  2022 Elsevier Inc.   Edwina BarthMiguel Arslan Kier, MD [facility]

## 2021-02-20 NOTE — Assessment & Plan Note (Signed)
Currently active.  Coping mechanisms to deal with anxiety discussed with patient.  We will start Lexapro 10 mg daily and refer to psychiatrist for further evaluation.  No red flag signs or symptoms.

## 2021-02-20 NOTE — Patient Instructions (Signed)
Social Anxiety Disorder, Adult Social anxiety disorder (SAD), previously called social phobia, is a mental health condition. People with SAD often feel nervous, afraid, or embarrassed when they are around other people in social situations. They worry that other people are judging or criticizing them for how they look, what they say, or howthey act. SAD involves more than just feeling shy or self-conscious at times. It can cause severe emotional distress. It can interfere with activities of dailylife. SAD also may lead to alcohol or drug use, and even suicide. SAD is a common mental health condition. It can develop at any time, but itusually starts in the teenage years. What are the causes? The cause of this condition is not known. It may involve genes that are passed through families. Stressful events may trigger anxiety. This disorder is also associated with an overactive amygdala. The amygdala is the part of the brainthat triggers your response to strong feelings, such as fear. What increases the risk? This condition is more likely to develop in: People who have a family history of anxiety disorders. Women. People who have a physical or behavioral condition that makes them feel self-conscious or nervous, such as a stutter or a long-term (chronic) disease. What are the signs or symptoms? The main symptom of this condition is fear of embarrassment caused by being criticized or judged in social situations. You may be afraid to: Speak in public. Go shopping. Use a public bathroom. Eat at a restaurant. Go to work. Interact with people you do not know. Extreme fear and anxiety may cause physical symptoms, including: Blushing. A fast heartbeat. Sweating. Shaky hands or voice. Confusion. Light-headedness. Upset stomach, diarrhea, or vomiting. Shortness of breath. How is this diagnosed? This condition is diagnosed based on your history, symptoms, and behavior in social situations. You may be  diagnosed with this type of anxiety if your symptoms have lasted for more than 6 months and have been present on more daysthan not. Your health care provider may ask you about your use of alcohol, drugs, and prescription medicines. He or she may also refer you to a mental healthspecialist for further evaluation or treatment. How is this treated? Treatment for this condition may include: Cognitive behavioral therapy (CBT). This type of talk therapy helps you learn to replace negative thoughts and behaviors with positive ones. This may include learning how to use self-calming skills and other methods of managing your anxiety. Exposure therapy. You will be exposed to social situations that cause you fear. The treatment starts with practicing self-calming in situations that cause you low levels of fear. Over time, you will progress by sustaining self-calming and managing harder situations. Antidepressant medicines. These medicines may be used by themselves or in addition to other therapies. Biofeedback. This process trains you to manage your body's response (physiological response) through breathing techniques and relaxation methods. You will work with a therapist while machines are used to monitor your physical symptoms. Techniques for relaxation and managing anxiety. These include deep breathing, self-talk, meditation, visual imagery, muscle relaxation, music therapy, and yoga. These techniques are often used with other therapies to keep you calm in situations that cause you anxiety. These treatments are often used in combination. Follow these instructions at home: Alcohol use If you drink alcohol: Limit how much you use to: 0-1 drink a day for nonpregnant women. 0-2 drinks a day for men. Be aware of how much alcohol is in your drink. In the [place] one drink equals one 12 oz bottle of beer (  355 mL), one 5 oz glass of wine (148 mL), or one 1 oz glass of hard liquor (44 mL). General instructions Take  over-the-counter and prescription medicines only as told by your health care provider. Practice techniques for relaxation and managing anxiety at times you are not challenged by social anxiety. Return to social activities using techniques you have learned, as you feel ready to do so. Avoid caffeine and certain over-the-counter cold medicines. These may make you feel worse. Ask your pharmacists which medicines to avoid. Keep all follow-up visits as told by your health care provider. This is important. Where to find more information National Alliance on Mental Illness (NAMI): https://www.nami.org Social Anxiety Association: https://socialphobia.org Mental Health America (MHA): https://www.mhanational.org Anxiety and Depression Association of America (ADAA): https://adaa.org/ Contact a health care provider if: Your symptoms do not improve or get worse. You have signs of depression, such as: Persistent sadness or moodiness. Loss of enjoyment in activities that used to bring you joy. Change in weight or eating. Changes in sleeping habits. Avoiding friends or family members more than usual. Loss of energy for normal tasks. Feeling guilty or worthless. You become more isolated than you normally are. You find it more and more difficult to speak or interact with others. You are using drugs. You are drinking more alcohol than usual. Get help right away if: You harm yourself. You have suicidal thoughts. If you ever feel like you may hurt yourself or others, or have thoughts about taking your own life, get help right away. You can go to your nearest emergency department or call: Your local emergency services (911 in the [place] A suicide crisis helpline, such as the National Suicide Prevention Lifeline at 1-800-273-8255. This is open 24 hours a day. Summary Social anxiety disorder (SAD) may cause you to feel nervous, afraid, or embarrassed when you are around other people in social situations. SAD  is a common mental disorder. It can develop at any time, but it usually starts in the teenage years. Treatment includes talk therapy, exposure therapy, medicines, biofeedback, and relaxation techniques. It can involve a combination of treatments. This information is not intended to replace advice given to you by your health care provider. Make sure you discuss any questions you have with your healthcare provider. Document Revised: 12/30/2018 Document Reviewed: 12/30/2018 Elsevier Patient Education  2022 Elsevier Inc.  

## 2021-03-31 ENCOUNTER — Encounter: Payer: Self-pay | Admitting: Emergency Medicine

## 2021-04-04 NOTE — Telephone Encounter (Signed)
Sent pt a mychart message in regards to Dr. Latrelle Dodrill recommendations.

## 2021-04-04 NOTE — Telephone Encounter (Signed)
Loss of libido is a side effect of Lexapro. May need to stop medication and follow up with Psychiatrist for further evaluation and treatment. Thanks.

## 2021-04-09 ENCOUNTER — Telehealth: Payer: Self-pay | Admitting: Emergency Medicine

## 2021-04-09 NOTE — Telephone Encounter (Signed)
Patient says at last visit provider advised he would put in a psychiatrist referral  Patient says no one has contacted him since then regarding referral  Please follow up w/ patient [phone]

## 2021-04-11 NOTE — Telephone Encounter (Signed)
Called and spoke with pt 04/10/21 to discuss referral.

## 2021-04-26 ENCOUNTER — Encounter: Payer: Self-pay | Admitting: Emergency Medicine

## 2021-05-01 ENCOUNTER — Ambulatory Visit (INDEPENDENT_AMBULATORY_CARE_PROVIDER_SITE_OTHER): Payer: Managed Care, Other (non HMO) | Admitting: Emergency Medicine

## 2021-05-01 ENCOUNTER — Other Ambulatory Visit: Payer: Self-pay

## 2021-05-01 ENCOUNTER — Encounter: Payer: Self-pay | Admitting: Emergency Medicine

## 2021-05-01 VITALS — BP 110/80 | HR 68 | Temp 98.7°F | Ht 67.0 in | Wt 160.0 lb

## 2021-05-01 DIAGNOSIS — Z13228 Encounter for screening for other metabolic disorders: Secondary | ICD-10-CM

## 2021-05-01 DIAGNOSIS — Z0000 Encounter for general adult medical examination without abnormal findings: Secondary | ICD-10-CM | POA: Diagnosis not present

## 2021-05-01 DIAGNOSIS — Z1329 Encounter for screening for other suspected endocrine disorder: Secondary | ICD-10-CM

## 2021-05-01 DIAGNOSIS — F4010 Social phobia, unspecified: Secondary | ICD-10-CM

## 2021-05-01 DIAGNOSIS — Z1159 Encounter for screening for other viral diseases: Secondary | ICD-10-CM | POA: Diagnosis not present

## 2021-05-01 DIAGNOSIS — Z23 Encounter for immunization: Secondary | ICD-10-CM | POA: Diagnosis not present

## 2021-05-01 DIAGNOSIS — Z125 Encounter for screening for malignant neoplasm of prostate: Secondary | ICD-10-CM | POA: Diagnosis not present

## 2021-05-01 DIAGNOSIS — Z130 Encounter for screening for diseases of the blood and blood-forming organs and certain disorders involving the immune mechanism: Secondary | ICD-10-CM | POA: Diagnosis not present

## 2021-05-01 DIAGNOSIS — Z13220 Encounter for screening for lipoid disorders: Secondary | ICD-10-CM

## 2021-05-01 DIAGNOSIS — K5909 Other constipation: Secondary | ICD-10-CM

## 2021-05-01 LAB — COMPREHENSIVE METABOLIC PANEL
ALT: 14 U/L (ref 0–53)
AST: 20 U/L (ref 0–37)
Albumin: 4.5 g/dL (ref 3.5–5.2)
Alkaline Phosphatase: 61 U/L (ref 39–117)
BUN: 11 mg/dL (ref 6–23)
CO2: 25 mEq/L (ref 19–32)
Calcium: 9.4 mg/dL (ref 8.4–10.5)
Chloride: 106 mEq/L (ref 96–112)
Creatinine, Ser: 1 mg/dL (ref 0.40–1.50)
GFR: 85.89 mL/min (ref >60.00)
Glucose, Bld: 86 mg/dL (ref 70–99)
Potassium: 4 mEq/L (ref 3.5–5.1)
Sodium: 139 mEq/L (ref 135–145)
Total Bilirubin: 0.8 mg/dL (ref 0.2–1.2)
Total Protein: 6.9 g/dL (ref 6.0–8.3)

## 2021-05-01 LAB — LIPID PANEL
Cholesterol: 162 mg/dL (ref 0–200)
HDL: 49.2 mg/dL (ref >39.00)
LDL Cholesterol: 100 mg/dL — ABNORMAL HIGH (ref 0–99)
NonHDL: 112.59
Total CHOL/HDL Ratio: 3
Triglycerides: 63 mg/dL (ref 0.0–149.0)
VLDL: 12.6 mg/dL (ref 0.0–40.0)

## 2021-05-01 LAB — CBC WITH DIFFERENTIAL/PLATELET
Basophils Absolute: 0 10*3/uL (ref 0.0–0.1)
Basophils Relative: 0.5 % (ref 0.0–3.0)
Eosinophils Absolute: 0.1 10*3/uL (ref 0.0–0.7)
Eosinophils Relative: 4.1 % (ref 0.0–5.0)
HCT: 43.7 % (ref 39.0–52.0)
Hemoglobin: 13.8 g/dL (ref 13.0–17.0)
Lymphocytes Relative: 48.9 % — ABNORMAL HIGH (ref 12.0–46.0)
Lymphs Abs: 1.4 10*3/uL (ref 0.7–4.0)
MCHC: 31.5 g/dL (ref 30.0–36.0)
MCV: 78.2 fl (ref 78.0–100.0)
Monocytes Absolute: 0.3 10*3/uL (ref 0.1–1.0)
Monocytes Relative: 9.9 % (ref 3.0–12.0)
Neutro Abs: 1 10*3/uL — ABNORMAL LOW (ref 1.4–7.7)
Neutrophils Relative %: 36.6 % — ABNORMAL LOW (ref 43.0–77.0)
Platelets: 172 10*3/uL (ref 150.0–400.0)
RBC: 5.59 Mil/uL (ref 4.22–5.81)
RDW: 14.5 % (ref 11.5–15.5)
WBC: 2.8 10*3/uL — ABNORMAL LOW (ref 4.0–10.5)

## 2021-05-01 LAB — PSA: PSA: 0.44 ng/mL (ref 0.10–4.00)

## 2021-05-01 LAB — HEMOGLOBIN A1C: Hgb A1c MFr Bld: 5.5 % (ref 4.6–6.5)

## 2021-05-01 NOTE — Patient Instructions (Signed)
Health Maintenance, Male Adopting a healthy lifestyle and getting preventive care are important in promoting health and wellness. Ask your health care provider about: The right schedule for you to have regular tests and exams. Things you can do on your own to prevent diseases and keep yourself healthy. What should I know about diet, weight, and exercise? Eat a healthy diet  Eat a diet that includes plenty of vegetables, fruits, low-fat dairy products, and lean protein. Do not eat a lot of foods that are high in solid fats, added sugars, or sodium. Maintain a healthy weight Body mass index (BMI) is a measurement that can be used to identify possible weight problems. It estimates body fat based on height and weight. Your health care provider can help determine your BMI and help you achieve or maintain a healthy weight. Get regular exercise Get regular exercise. This is one of the most important things you can do for your health. Most adults should: Exercise for at least 150 minutes each week. The exercise should increase your heart rate and make you sweat (moderate-intensity exercise). Do strengthening exercises at least twice a week. This is in addition to the moderate-intensity exercise. Spend less time sitting. Even light physical activity can be beneficial. Watch cholesterol and blood lipids Have your blood tested for lipids and cholesterol at 53 years of age, then have this test every 5 years. You may need to have your cholesterol levels checked more often if: Your lipid or cholesterol levels are high. You are older than 53 years of age. You are at high risk for heart disease. What should I know about cancer screening? Many types of cancers can be detected early and may often be prevented. Depending on your health history and family history, you may need to have cancer screening at various ages. This may include screening for: Colorectal cancer. Prostate cancer. Skin cancer. Lung  cancer. What should I know about heart disease, diabetes, and high blood pressure? Blood pressure and heart disease High blood pressure causes heart disease and increases the risk of stroke. This is more likely to develop in people who have high blood pressure readings, are of African descent, or are overweight. Talk with your health care provider about your target blood pressure readings. Have your blood pressure checked: Every 3-5 years if you are 18-39 years of age. Every year if you are 40 years old or older. If you are between the ages of 65 and 75 and are a current or former smoker, ask your health care provider if you should have a one-time screening for abdominal aortic aneurysm (AAA). Diabetes Have regular diabetes screenings. This checks your fasting blood sugar level. Have the screening done: Once every three years after age 45 if you are at a normal weight and have a low risk for diabetes. More often and at a younger age if you are overweight or have a high risk for diabetes. What should I know about preventing infection? Hepatitis B If you have a higher risk for hepatitis B, you should be screened for this virus. Talk with your health care provider to find out if you are at risk for hepatitis B infection. Hepatitis C Blood testing is recommended for: Everyone born from 1945 through 1965. Anyone with known risk factors for hepatitis C. Sexually transmitted infections (STIs) You should be screened each year for STIs, including gonorrhea and chlamydia, if: You are sexually active and are younger than 53 years of age. You are older than 53 years   of age and your health care provider tells you that you are at risk for this type of infection. Your sexual activity has changed since you were last screened, and you are at increased risk for chlamydia or gonorrhea. Ask your health care provider if you are at risk. Ask your health care provider about whether you are at high risk for HIV.  Your health care provider may recommend a prescription medicine to help prevent HIV infection. If you choose to take medicine to prevent HIV, you should first get tested for HIV. You should then be tested every 3 months for as long as you are taking the medicine. Follow these instructions at home: Lifestyle Do not use any products that contain nicotine or tobacco, such as cigarettes, e-cigarettes, and chewing tobacco. If you need help quitting, ask your health care provider. Do not use street drugs. Do not share needles. Ask your health care provider for help if you need support or information about quitting drugs. Alcohol use Do not drink alcohol if your health care provider tells you not to drink. If you drink alcohol: Limit how much you have to 0-2 drinks a day. Be aware of how much alcohol is in your drink. In the [place] one drink equals one 12 oz bottle of beer (355 mL), one 5 oz glass of wine (148 mL), or one 1 oz glass of hard liquor (44 mL). General instructions Schedule regular health, dental, and eye exams. Stay current with your vaccines. Tell your health care provider if: You often feel depressed. You have ever been abused or do not feel safe at home. Summary Adopting a healthy lifestyle and getting preventive care are important in promoting health and wellness. Follow your health care provider's instructions about healthy diet, exercising, and getting tested or screened for diseases. Follow your health care provider's instructions on monitoring your cholesterol and blood pressure. This information is not intended to replace advice given to you by your health care provider. Make sure you discuss any questions you have with your health care provider. Document Revised: 10/06/2020 Document Reviewed: 07/22/2018 Elsevier Patient Education  2022 Elsevier Inc.  

## 2021-05-01 NOTE — Progress Notes (Signed)
Tyrone Hendricks 55 y.o.   Chief Complaint  Patient presents with   Annual Exam    HISTORY OF PRESENT ILLNESS: This is a 55 y.o. male here for annual exam. Has the following chronic medical problems: 1.  Social anxiety.  Scheduled to see psychiatrist, virtual visit next Friday Did not do well on Lexapro 10 mg prescribed last time so he stopped it.  Had significant loss of libido. 2.  Chronic constipation.  Doing better. No other complaints or medical concerns today.  HPI   Prior to Admission medications   Medication Sig Start Date End Date Taking? Authorizing Provider  fluticasone (FLONASE) 50 MCG/ACT nasal spray USE 2 SPRAYS IN EACH NOSTRIL DAILY AS DIRECTED 02/20/21  Yes Bindi Klomp, Eilleen Kempf, MD  escitalopram (LEXAPRO) 10 MG tablet Take 1 tablet (10 mg total) by mouth daily. 02/20/21 05/21/21  Georgina Quint, MD  fluticasone [facility]) 50 MCG/ACT nasal spray Place 2 sprays into both nostrils daily. Patient not taking: Reported on 05/01/2021 02/20/21   Georgina Quint, MD  fluticasone [facility]) 50 MCG/ACT nasal spray USE 2 SPRAYS IN [facility] NOSTRIL DAILY AS DIRECTED 12/13/19   Lezlie Lye, Meda Coffee, MD    Allergies  Allergen Reactions   Other Other (See Comments)    sneezing    Patient Active Problem List   Diagnosis Date Noted   Social anxiety disorder 02/20/2021   Non-seasonal allergic rhinitis 02/20/2021   Constipation, chronic 07/13/2012   Depression 09/21/2011    Past Medical History:  Diagnosis Date   Allergy    Anxiety    Depression    Hemorrhoids    Unspecified constipation     Past Surgical History:  Procedure Laterality Date   unremarkable      Social History   Socioeconomic History   Marital status: Married    Spouse name: Not on file   Number of children: Not on file   Years of education: Not on file   Highest education level: Not on file  Occupational History   Not on file  Tobacco Use   Smoking status: Former    Types: Cigarettes     Quit date: 11/20/2006    Years since quitting: 14.4   Smokeless tobacco: Never  Substance and Sexual Activity   Alcohol use: No   Drug use: No   Sexual activity: Yes  Other Topics Concern   Not on file  Social History Narrative   Pt from [place].  Came to [place] in 2001.       Social Determinants of Health   Financial Resource Strain: Not on file  Food Insecurity: Not on file  Transportation Needs: Not on file  Physical Activity: Not on file  Stress: Not on file  Social Connections: Not on file  Intimate Partner Violence: Not on file    Family History  Problem Relation Age of Onset   Stomach cancer Father    Colon cancer Neg Hx    Colon polyps Neg Hx      Review of Systems  Constitutional: Negative.  Negative for chills and fever.  HENT: Negative.  Negative for congestion and sore throat.   Respiratory: Negative.  Negative for cough and shortness of breath.   Cardiovascular: Negative.  Negative for chest pain and palpitations.  Gastrointestinal: Negative.  Negative for abdominal pain, diarrhea, nausea and vomiting.  Genitourinary: Negative.  Negative for dysuria and hematuria.  Skin: Negative.  Negative for rash.  Neurological:  Negative for dizziness and headaches.  All  other systems reviewed and are negative.  Vitals:   05/01/21 0809  BP: 110/80  Pulse: 68  Temp: 98.7 F (37.1 C)  SpO2: 95%   Wt Readings from Last 3 Encounters:  05/01/21 160 lb (72.6 kg)  02/20/21 166 lb (75.3 kg)  04/24/20 163 lb (73.9 kg)    Physical Exam Vitals reviewed.  Constitutional:      Appearance: Normal appearance.  HENT:     Head: Normocephalic.     Right Ear: Tympanic membrane, ear canal and external ear normal.     Left Ear: Tympanic membrane, ear canal and external ear normal.     Mouth/Throat:     Mouth: Mucous membranes are moist.     Pharynx: Oropharynx is clear.  Eyes:     Extraocular Movements: Extraocular movements intact.     Conjunctiva/sclera: Conjunctivae  normal.     Pupils: Pupils are equal, round, and reactive to light.  Neck:     Vascular: No carotid bruit.  Cardiovascular:     Rate and Rhythm: Normal rate and regular rhythm.     Pulses: Normal pulses.     Heart sounds: Normal heart sounds.  Pulmonary:     Effort: Pulmonary effort is normal.     Breath sounds: Normal breath sounds.  Abdominal:     General: Bowel sounds are normal. There is no distension.     Palpations: Abdomen is soft. There is no mass.     Tenderness: There is no abdominal tenderness. There is no guarding.  Musculoskeletal:        General: Normal range of motion.     Cervical back: Normal range of motion and neck supple. No tenderness.     Right lower leg: No edema.     Left lower leg: No edema.  Lymphadenopathy:     Cervical: No cervical adenopathy.  Skin:    General: Skin is warm and dry.     Capillary Refill: Capillary refill takes less than 2 seconds.  Neurological:     General: No focal deficit present.     Mental Status: He is alert and oriented to person, place, and time.  Psychiatric:        Mood and Affect: Mood normal.        Behavior: Behavior normal.     ASSESSMENT & PLAN: [name] was seen today for annual exam.  Diagnoses and all orders for this visit:  Routine general medical examination at a health care facility  Need for hepatitis C screening test -     Hepatitis C antibody screen  Prostate cancer screening -     PSA(Must document that pt has been informed of limitations of PSA testing.)  Screening for deficiency anemia -     CBC with Differential  Screening for lipoid disorders -     Lipid panel  Screening for endocrine, metabolic and immunity disorder -     Comprehensive metabolic panel -     Hemoglobin A1c  Need for influenza vaccination -     Flu Vaccine QUAD 59mo+IM (Fluarix, Fluzone & Alfiuria Quad PF)  Social anxiety disorder  Constipation, chronic  Modifiable risk factors discussed with patient. Anticipatory  guidance according to age provided. The following topics were also discussed: Social Determinants of Health Smoking.  Patient not a smoker. Diet and nutrition Chronic constipation and need to increase daily fiber intake. Benefits of exercise Cancer screening and review of most recent colonoscopy results which were normal. Vaccinations recommendations Cardiovascular risk assessment Mental  health including depression and anxiety.  Need to follow-up with psychiatrist next Friday as scheduled. Fall and accident prevention  Patient Instructions  Health Maintenance, Male Adopting a healthy lifestyle and getting preventive care are important in promoting health and wellness. Ask your health care provider about: The right schedule for you to have regular tests and exams. Things you can do on your own to prevent diseases and keep yourself healthy. What should I know about diet, weight, and exercise? Eat a healthy diet  Eat a diet that includes plenty of vegetables, fruits, low-fat dairy products, and lean protein. Do not eat a lot of foods that are high in solid fats, added sugars, or sodium. Maintain a healthy weight Body mass index (BMI) is a measurement that can be used to identify possible weight problems. It estimates body fat based on height and weight. Your health care provider can help determine your BMI and help you achieve or maintain a healthy weight. Get regular exercise Get regular exercise. This is one of the most important things you can do for your health. Most adults should: Exercise for at least 150 minutes each week. The exercise should increase your heart rate and make you sweat (moderate-intensity exercise). Do strengthening exercises at least twice a week. This is in addition to the moderate-intensity exercise. Spend less time sitting. Even light physical activity can be beneficial. Watch cholesterol and blood lipids Have your blood tested for lipids and cholesterol at 55  years of age, then have this test every 5 years. You may need to have your cholesterol levels checked more often if: Your lipid or cholesterol levels are high. You are older than 53 years of age. You are at high risk for heart disease. What should I know about cancer screening? Many types of cancers can be detected early and may often be prevented. Depending on your health history and family history, you may need to have cancer screening at various ages. This may include screening for: Colorectal cancer. Prostate cancer. Skin cancer. Lung cancer. What should I know about heart disease, diabetes, and high blood pressure? Blood pressure and heart disease High blood pressure causes heart disease and increases the risk of stroke. This is more likely to develop in people who have high blood pressure readings, are of African descent, or are overweight. Talk with your health care provider about your target blood pressure readings. Have your blood pressure checked: Every 3-5 years if you are 37-20 years of age. Every year if you are 52 years old or older. If you are between the ages of 32 and 53 and are a current or former smoker, ask your health care provider if you should have a one-time screening for abdominal aortic aneurysm (AAA). Diabetes Have regular diabetes screenings. This checks your fasting blood sugar level. Have the screening done: Once every three years after age 69 if you are at a normal weight and have a low risk for diabetes. More often and at a younger age if you are overweight or have a high risk for diabetes. What should I know about preventing infection? Hepatitis B If you have a higher risk for hepatitis B, you should be screened for this virus. Talk with your health care provider to find out if you are at risk for hepatitis B infection. Hepatitis C Blood testing is recommended for: Everyone born from [birth year] through 1965. Anyone with known risk factors for hepatitis  C. Sexually transmitted infections (STIs) You should be screened each year for  STIs, including gonorrhea and chlamydia, if: You are sexually active and are younger than 53 years of age. You are older than 52 years of age and your health care provider tells you that you are at risk for this type of infection. Your sexual activity has changed since you were last screened, and you are at increased risk for chlamydia or gonorrhea. Ask your health care provider if you are at risk. Ask your health care provider about whether you are at high risk for HIV. Your health care provider may recommend a prescription medicine to help prevent HIV infection. If you choose to take medicine to prevent HIV, you should first get tested for HIV. You should then be tested every 3 months for as long as you are taking the medicine. Follow these instructions at home: Lifestyle Do not use any products that contain nicotine or tobacco, such as cigarettes, e-cigarettes, and chewing tobacco. If you need help quitting, ask your health care provider. Do not use street drugs. Do not share needles. Ask your health care provider for help if you need support or information about quitting drugs. Alcohol use Do not drink alcohol if your health care provider tells you not to drink. If you drink alcohol: Limit how much you have to 0-2 drinks a day. Be aware of how much alcohol is in your drink. In the [place] one drink equals one 12 oz bottle of beer (355 mL), one 5 oz glass of wine (148 mL), or one 1 oz glass of hard liquor (44 mL). General instructions Schedule regular health, dental, and eye exams. Stay current with your vaccines. Tell your health care provider if: You often feel depressed. You have ever been abused or do not feel safe at home. Summary Adopting a healthy lifestyle and getting preventive care are important in promoting health and wellness. Follow your health care provider's instructions about healthy diet,  exercising, and getting tested or screened for diseases. Follow your health care provider's instructions on monitoring your cholesterol and blood pressure. This information is not intended to replace advice given to you by your health care provider. Make sure you discuss any questions you have with your health care provider. Document Revised: 10/06/2020 Document Reviewed: 07/22/2018 Elsevier Patient Education  2022 Elsevier Inc.     Edwina Barth, MD [facility]

## 2021-05-02 LAB — HEPATITIS C ANTIBODY
Hepatitis C Ab: NONREACTIVE
SIGNAL TO CUT-OFF: 0.01 (ref <1.00)

## 2021-05-30 ENCOUNTER — Emergency Department (HOSPITAL_COMMUNITY): Payer: Worker's Compensation | Admitting: Certified Registered Nurse Anesthetist

## 2021-05-30 ENCOUNTER — Encounter (HOSPITAL_COMMUNITY): Payer: Self-pay | Admitting: Emergency Medicine

## 2021-05-30 ENCOUNTER — Emergency Department (HOSPITAL_COMMUNITY): Payer: Worker's Compensation

## 2021-05-30 ENCOUNTER — Encounter (HOSPITAL_COMMUNITY)
Admission: EM | Disposition: A | Discharge status: Home / Self Care | Payer: Self-pay | Source: Home / Self Care | Attending: Emergency Medicine

## 2021-05-30 ENCOUNTER — Ambulatory Visit (HOSPITAL_COMMUNITY)
Admission: EM | Admit: 2021-05-30 | Discharge: 2021-05-30 | Disposition: A | Discharge status: Home / Self Care | Payer: Worker's Compensation | Attending: Emergency Medicine | Admitting: Emergency Medicine

## 2021-05-30 DIAGNOSIS — Z87891 Personal history of nicotine dependence: Secondary | ICD-10-CM | POA: Insufficient documentation

## 2021-05-30 DIAGNOSIS — Y990 Civilian activity done for income or pay: Secondary | ICD-10-CM | POA: Insufficient documentation

## 2021-05-30 DIAGNOSIS — Z20822 Contact with and (suspected) exposure to covid-19: Secondary | ICD-10-CM | POA: Diagnosis not present

## 2021-05-30 DIAGNOSIS — S6722XA Crushing injury of left hand, initial encounter: Secondary | ICD-10-CM | POA: Diagnosis present

## 2021-05-30 DIAGNOSIS — W230XXA Caught, crushed, jammed, or pinched between moving objects, initial encounter: Secondary | ICD-10-CM | POA: Diagnosis not present

## 2021-05-30 DIAGNOSIS — S67197A Crushing injury of left little finger, initial encounter: Secondary | ICD-10-CM | POA: Insufficient documentation

## 2021-05-30 DIAGNOSIS — S62317A Displaced fracture of base of fifth metacarpal bone. left hand, initial encounter for closed fracture: Secondary | ICD-10-CM | POA: Insufficient documentation

## 2021-05-30 DIAGNOSIS — S62325A Displaced fracture of shaft of fourth metacarpal bone, left hand, initial encounter for closed fracture: Secondary | ICD-10-CM | POA: Diagnosis not present

## 2021-05-30 HISTORY — PX: PERCUTANEOUS PINNING: SHX2209

## 2021-05-30 LAB — RESP PANEL BY RT-PCR (FLU A&B, COVID) ARPGX2
Influenza A by PCR: NEGATIVE
Influenza B by PCR: NEGATIVE
SARS Coronavirus 2 by RT PCR: NEGATIVE

## 2021-05-30 SURGERY — PINNING, EXTREMITY, PERCUTANEOUS
Anesthesia: Choice

## 2021-05-30 SURGERY — PINNING, EXTREMITY, PERCUTANEOUS
Anesthesia: General | Site: Hand | Laterality: Left

## 2021-05-30 MED ORDER — PROPOFOL 10 MG/ML IV BOLUS
INTRAVENOUS | Status: AC
  Filled 2021-05-30: qty 20

## 2021-05-30 MED ORDER — CHLORHEXIDINE GLUCONATE 0.12 % MT SOLN
OROMUCOSAL | Status: AC
  Administered 2021-05-30: 15 mL
  Filled 2021-05-30: qty 15

## 2021-05-30 MED ORDER — FENTANYL CITRATE (PF) 100 MCG/2ML IJ SOLN
INTRAMUSCULAR | Status: AC
  Filled 2021-05-30: qty 2

## 2021-05-30 MED ORDER — PHENYLEPHRINE 40 MCG/ML (10ML) SYRINGE FOR IV PUSH (FOR BLOOD PRESSURE SUPPORT)
PREFILLED_SYRINGE | INTRAVENOUS | Status: DC | PRN
  Administered 2021-05-30: 120 ug via INTRAVENOUS

## 2021-05-30 MED ORDER — FENTANYL CITRATE (PF) 100 MCG/2ML IJ SOLN
25.0000 ug | INTRAMUSCULAR | Status: DC | PRN
  Administered 2021-05-30 (×3): 50 ug via INTRAVENOUS

## 2021-05-30 MED ORDER — 0.9 % SODIUM CHLORIDE (POUR BTL) OPTIME
TOPICAL | Status: DC | PRN
  Administered 2021-05-30: 1000 mL

## 2021-05-30 MED ORDER — POVIDONE-IODINE 10 % EX SWAB
2.0000 "application " | Freq: Once | CUTANEOUS | Status: AC
  Administered 2021-05-30: 2 via TOPICAL

## 2021-05-30 MED ORDER — LIDOCAINE 2% (20 MG/ML) 5 ML SYRINGE
INTRAMUSCULAR | Status: AC
  Filled 2021-05-30: qty 5

## 2021-05-30 MED ORDER — ONDANSETRON HCL 4 MG/2ML IJ SOLN
INTRAMUSCULAR | Status: DC | PRN
  Administered 2021-05-30: 4 mg via INTRAVENOUS

## 2021-05-30 MED ORDER — DEXAMETHASONE SODIUM PHOSPHATE 10 MG/ML IJ SOLN
INTRAMUSCULAR | Status: AC
  Filled 2021-05-30: qty 1

## 2021-05-30 MED ORDER — OXYCODONE HCL 5 MG PO TABS
5.0000 mg | ORAL_TABLET | Freq: Once | ORAL | Status: AC
  Administered 2021-05-30: 5 mg via ORAL

## 2021-05-30 MED ORDER — CEPHALEXIN 500 MG PO CAPS: 500.0000 mg | ORAL_CAPSULE | Freq: Four times a day (QID) | ORAL | 0 refills | Status: AC

## 2021-05-30 MED ORDER — LACTATED RINGERS IV SOLN: INTRAVENOUS | Status: DC

## 2021-05-30 MED ORDER — FENTANYL CITRATE (PF) 250 MCG/5ML IJ SOLN
INTRAMUSCULAR | Status: AC
  Filled 2021-05-30: qty 5

## 2021-05-30 MED ORDER — CEFAZOLIN SODIUM-DEXTROSE 2-4 GM/100ML-% IV SOLN
2.0000 g | Freq: Once | INTRAVENOUS | Status: AC
  Administered 2021-05-30: 2 g via INTRAVENOUS
  Filled 2021-05-30: qty 100

## 2021-05-30 MED ORDER — CEFAZOLIN SODIUM-DEXTROSE 2-4 GM/100ML-% IV SOLN
2.0000 g | INTRAVENOUS | Status: AC
Start: 2021-05-31 — End: 2021-05-30
  Administered 2021-05-30: 2 g via INTRAVENOUS

## 2021-05-30 MED ORDER — ONDANSETRON HCL 4 MG/2ML IJ SOLN
INTRAMUSCULAR | Status: AC
  Filled 2021-05-30: qty 2

## 2021-05-30 MED ORDER — FENTANYL CITRATE PF 50 MCG/ML IJ SOSY
50.0000 ug | PREFILLED_SYRINGE | Freq: Once | INTRAMUSCULAR | Status: AC
  Administered 2021-05-30: 50 ug via INTRAVENOUS
  Filled 2021-05-30: qty 1

## 2021-05-30 MED ORDER — ORAL CARE MOUTH RINSE: 15.0000 mL | Freq: Once | OROMUCOSAL | Status: AC

## 2021-05-30 MED ORDER — LACTATED RINGERS IV SOLN: INTRAVENOUS | Status: DC | PRN

## 2021-05-30 MED ORDER — FENTANYL CITRATE (PF) 100 MCG/2ML IJ SOLN
INTRAMUSCULAR | Status: DC | PRN
  Administered 2021-05-30: 25 ug via INTRAVENOUS
  Administered 2021-05-30: 50 ug via INTRAVENOUS

## 2021-05-30 MED ORDER — DEXAMETHASONE SODIUM PHOSPHATE 4 MG/ML IJ SOLN
INTRAMUSCULAR | Status: DC | PRN
  Administered 2021-05-30: 10 mg via INTRAVENOUS

## 2021-05-30 MED ORDER — CHLORHEXIDINE GLUCONATE 0.12 % MT SOLN: 15.0000 mL | Freq: Once | OROMUCOSAL | Status: AC

## 2021-05-30 MED ORDER — PROMETHAZINE HCL 25 MG/ML IJ SOLN: 6.2500 mg | INTRAMUSCULAR | Status: DC | PRN

## 2021-05-30 MED ORDER — OXYCODONE-ACETAMINOPHEN 5-325 MG PO TABS: 1.0000 | ORAL_TABLET | ORAL | 0 refills | Status: AC | PRN

## 2021-05-30 MED ORDER — OXYCODONE HCL 5 MG PO TABS
ORAL_TABLET | ORAL | Status: AC
  Filled 2021-05-30: qty 1

## 2021-05-30 MED ORDER — CHLORHEXIDINE GLUCONATE 4 % EX LIQD
60.0000 mL | Freq: Once | CUTANEOUS | Status: DC
  Filled 2021-05-30: qty 60

## 2021-05-30 MED ORDER — MIDAZOLAM HCL 2 MG/2ML IJ SOLN
INTRAMUSCULAR | Status: AC
  Filled 2021-05-30: qty 2

## 2021-05-30 MED ORDER — CEFAZOLIN SODIUM-DEXTROSE 2-4 GM/100ML-% IV SOLN
INTRAVENOUS | Status: AC
  Filled 2021-05-30: qty 100

## 2021-05-30 MED ORDER — MIDAZOLAM HCL 5 MG/5ML IJ SOLN
INTRAMUSCULAR | Status: DC | PRN
  Administered 2021-05-30: 2 mg via INTRAVENOUS

## 2021-05-30 MED ORDER — ACETAMINOPHEN 500 MG PO TABS: 1000.0000 mg | ORAL_TABLET | Freq: Once | ORAL | Status: AC

## 2021-05-30 MED ORDER — LIDOCAINE HCL (CARDIAC) PF 100 MG/5ML IV SOSY
PREFILLED_SYRINGE | INTRAVENOUS | Status: DC | PRN
  Administered 2021-05-30: 80 mg via INTRAVENOUS

## 2021-05-30 MED ORDER — ACETAMINOPHEN 500 MG PO TABS
ORAL_TABLET | ORAL | Status: AC
  Administered 2021-05-30: 1000 mg via ORAL
  Filled 2021-05-30: qty 2

## 2021-05-30 MED ORDER — PROPOFOL 10 MG/ML IV BOLUS
INTRAVENOUS | Status: DC | PRN
  Administered 2021-05-30: 150 mg via INTRAVENOUS

## 2021-05-30 SURGICAL SUPPLY — 41 items
BAG COUNTER SPONGE SURGICOUNT (BAG) ×2 IMPLANT
BENZOIN TINCTURE PRP APPL 2/3 (GAUZE/BANDAGES/DRESSINGS) IMPLANT
BLADE CLIPPER SURG (BLADE) IMPLANT
BNDG ELASTIC 3X5.8 VLCR STR LF (GAUZE/BANDAGES/DRESSINGS) ×2 IMPLANT
BNDG ELASTIC 4X5.8 VLCR STR LF (GAUZE/BANDAGES/DRESSINGS) ×4 IMPLANT
BNDG GAUZE ELAST 4 BULKY (GAUZE/BANDAGES/DRESSINGS) ×2 IMPLANT
COVER SURGICAL LIGHT HANDLE (MISCELLANEOUS) ×2 IMPLANT
CUFF TOURN SGL QUICK 18X4 (TOURNIQUET CUFF) IMPLANT
CUFF TOURN SGL QUICK 24 (TOURNIQUET CUFF)
CUFF TRNQT CYL 24X4X16.5-23 (TOURNIQUET CUFF) IMPLANT
DRAPE OEC MINIVIEW 54X84 (DRAPES) ×2 IMPLANT
DRSG ADAPTIC 3X8 NADH LF (GAUZE/BANDAGES/DRESSINGS) ×2 IMPLANT
DRSG EMULSION OIL 3X3 NADH (GAUZE/BANDAGES/DRESSINGS) IMPLANT
GAUZE 4X4 16PLY ~~LOC~~+RFID DBL (SPONGE) ×2 IMPLANT
GAUZE SPONGE 4X4 12PLY STRL (GAUZE/BANDAGES/DRESSINGS) ×4 IMPLANT
GAUZE XEROFORM 1X8 LF (GAUZE/BANDAGES/DRESSINGS) IMPLANT
GAUZE XEROFORM 5X9 LF (GAUZE/BANDAGES/DRESSINGS) ×2 IMPLANT
GLOVE SURG ORTHO LTX SZ8 (GLOVE) ×2 IMPLANT
GLOVE SURG UNDER POLY LF SZ8.5 (GLOVE) ×2 IMPLANT
GOWN STRL REUS W/ TWL LRG LVL3 (GOWN DISPOSABLE) ×2 IMPLANT
GOWN STRL REUS W/TWL LRG LVL3 (GOWN DISPOSABLE) ×2
K-WIRE .045X4 (WIRE) ×8 IMPLANT
KIT BASIN OR (CUSTOM PROCEDURE TRAY) ×2 IMPLANT
KIT TURNOVER KIT B (KITS) ×2 IMPLANT
NS IRRIG 1000ML POUR BTL (IV SOLUTION) ×2 IMPLANT
PACK ORTHO EXTREMITY (CUSTOM PROCEDURE TRAY) ×2 IMPLANT
PAD ARMBOARD 7.5X6 YLW CONV (MISCELLANEOUS) ×4 IMPLANT
PAD CAST 4YDX4 CTTN HI CHSV (CAST SUPPLIES) ×1 IMPLANT
PADDING CAST COTTON 4X4 STRL (CAST SUPPLIES) ×1
SOAP 2 % CHG 4 OZ (WOUND CARE) ×2 IMPLANT
SPLINT FIBERGLASS 3X12 (CAST SUPPLIES) ×2 IMPLANT
SPONGE T-LAP 18X18 ~~LOC~~+RFID (SPONGE) ×2 IMPLANT
STRIP CLOSURE SKIN 1/2X4 (GAUZE/BANDAGES/DRESSINGS) ×2 IMPLANT
SUT ETHILON 4 0 P 3 18 (SUTURE) ×2 IMPLANT
SUT ETHILON 5 0 P 3 18 (SUTURE)
SUT NYLON ETHILON 5-0 P-3 1X18 (SUTURE) IMPLANT
SUT PROLENE 4 0 P 3 18 (SUTURE) IMPLANT
SUT PROLENE 4 0 PS 2 18 (SUTURE) ×2 IMPLANT
TOWEL GREEN STERILE (TOWEL DISPOSABLE) ×2 IMPLANT
TOWEL GREEN STERILE FF (TOWEL DISPOSABLE) ×2 IMPLANT
WATER STERILE IRR 1000ML POUR (IV SOLUTION) IMPLANT

## 2021-05-30 NOTE — Transfer of Care (Signed)
Immediate Anesthesia Transfer of Care Note  Patient: Tyrone Hendricks  Procedure(s) Performed: Left hand Irrigation and Debridment, closed reduction percutaneous pinning (Left: Hand)  Patient Location: PACU  Anesthesia Type:General  Level of Consciousness: awake, alert  and oriented  Airway & Oxygen Therapy: Patient Spontanous Breathing  Post-op Assessment: Report given to RN and Post -op Vital signs reviewed and stable  Post vital signs: Reviewed and stable  Last Vitals:  Vitals Value Taken Time  BP    Temp    Pulse 73 05/30/21 2044  Resp 20 05/30/21 2044  SpO2 97 % 05/30/21 2044  Vitals shown include unvalidated device data.  Last Pain:  Vitals:   05/30/21 1843  TempSrc: Oral  PainSc: 9          Complications: No notable events documented.

## 2021-05-30 NOTE — ED Triage Notes (Signed)
Patient BIB [facility] after his left hand was crushed in a machine roller. 20g saline lock in right AC, received fentanyl PTA. Patient able to move fingers in left hand, ROM limited by pain.

## 2021-05-30 NOTE — ED Provider Notes (Signed)
[facility] EMERGENCY DEPARTMENT Provider Note   CSN: [id number] Arrival date & time: 05/30/21  1105     History Chief Complaint  Patient presents with   Hand Injury    Tyrone Hendricks is a 55 y.o. male.  The history is provided by the patient.  Hand Injury Location:  Hand and wrist Wrist location:  L wrist Hand location:  L hand, L palm and dorsum of L hand Injury: yes   Time since incident:  1 hour Mechanism of injury: crush   Mechanism of injury comment:  Patient was at work and got his hand stuck in a mechanical roller Crush injury:    Mechanism:  Publishing copy   Duration of crushing force:  20 seconds Pain details:    Quality:  Aching, pressure, shooting and throbbing   Radiates to:  Does not radiate   Severity:  Moderate   Onset quality:  Sudden   Timing:  Constant   Progression:  Unchanged Handedness:  Right-handed Tetanus status:  Up to date Prior injury to area:  No Relieved by:  Narcotics and immobilization Worsened by:  Movement Ineffective treatments:  None tried Associated symptoms: decreased range of motion, stiffness and swelling       Past Medical History:  Diagnosis Date   Allergy    Anxiety    Depression    Hemorrhoids    Unspecified constipation     Patient Active Problem List   Diagnosis Date Noted   Social anxiety disorder 02/20/2021   Non-seasonal allergic rhinitis 02/20/2021   Constipation, chronic 07/13/2012   Depression 09/21/2011    Past Surgical History:  Procedure Laterality Date   unremarkable         Family History  Problem Relation Age of Onset   Stomach cancer Father    Colon cancer Neg Hx    Colon polyps Neg Hx     Social History   Tobacco Use   Smoking status: Former    Types: Cigarettes    Quit date: 11/20/2006    Years since quitting: 14.5   Smokeless tobacco: Never  Substance Use Topics   Alcohol use: No   Drug use: No    Home Medications Prior to Admission medications    Medication Sig Start Date End Date Taking? Authorizing Provider  fluticasone (FLONASE) 50 MCG/ACT nasal spray Place 2 sprays into both nostrils daily. Patient not taking: Reported on 05/01/2021 02/20/21   Georgina Quint, MD  fluticasone [facility]) 50 MCG/ACT nasal spray USE 2 SPRAYS IN [facility] NOSTRIL DAILY AS DIRECTED 02/20/21   Georgina Quint, MD  fluticasone [facility]) 50 MCG/ACT nasal spray USE 2 SPRAYS IN [facility] NOSTRIL DAILY AS DIRECTED 12/13/19   Lezlie Lye, Meda Coffee, MD    Allergies    Other  Review of Systems   Review of Systems  Musculoskeletal:  Positive for stiffness.  All other systems reviewed and are negative.  Physical Exam Updated Vital Signs BP 130/80   Pulse 67   Temp 97.7 F (36.5 C) (Oral)   Resp 19   SpO2 100%   Physical Exam Vitals and nursing note reviewed.  Constitutional:      General: He is not in acute distress.    Appearance: Normal appearance. He is normal weight.  HENT:     Head: Normocephalic and atraumatic.  Eyes:     Pupils: Pupils are equal, round, and reactive to light.  Cardiovascular:     Rate and Rhythm: Normal rate.  Pulses: Normal pulses.  Pulmonary:     Effort: Pulmonary effort is normal. No respiratory distress.  Musculoskeletal:        General: Tenderness, deformity and signs of injury present.     Left wrist: Swelling and deformity present.     Left hand: Swelling, deformity, tenderness and bony tenderness present. Decreased range of motion. Normal pulse.     Comments: Notable swelling and deformity noted to the left hand specifically between the webspace of the first and second digit on the left hand with what appears to be bone protruding.  Sensation intact in all 5 fingers.  Also notable swelling and mild deformity of the left wrist.  2+ palpable radial pulse.  No abnormality to the mid or upper forearm.  Elbow is normal.  Minimal movement to the fingers due to pain.  Skin:    General: Skin is warm and dry.      Capillary Refill: Capillary refill takes less than 2 seconds.  Neurological:     General: No focal deficit present.     Mental Status: He is alert. Mental status is at baseline.  Psychiatric:        Mood and Affect: Mood normal.        Behavior: Behavior normal.    ED Results / Procedures / Treatments   Labs (all labs ordered are listed, but only abnormal results are displayed) Labs Reviewed  RESP PANEL BY RT-PCR (FLU A&B, COVID) [id number]    EKG None  Radiology DG Wrist Complete Left  Result Date: 05/30/2021 CLINICAL DATA:  Crushing injury, pain EXAM: LEFT WRIST - COMPLETE 3+ VIEW COMPARISON:  None. FINDINGS: No fracture or dislocation of the left wrist. The carpus is normally aligned. Joint spaces are preserved. There are mildly displaced fractures of the bases of the left fourth and fifth metacarpals. Diffuse soft tissue edema about the wrist and dorsum of the hand. Subcutaneous gas about the dorsum the hand. IMPRESSION: 1. No fracture or dislocation of the left wrist proper. The carpus is normally aligned. 2. Mildly displaced fractures of the bases of the left fourth and fifth metacarpals however better evaluated by dedicated radiographs of the hand. 3. Diffuse soft tissue edema about the wrist and dorsum of the hand. Subcutaneous gas about the dorsum of the hand. Electronically Signed   By: Jearld Lesch M.D.   On: 05/30/2021 13:15   DG Hand Complete Left  Result Date: 05/30/2021 CLINICAL DATA:  55 year old male with crush injury. EXAM: LEFT HAND - COMPLETE 3+ VIEW COMPARISON:  Left finger series 07/03/2012. FINDINGS: Widespread abnormal soft tissue gas about the hand and wrist. Distal radius and ulna appear to remain intact. Carpal bone alignment maintained. Fracture at the base of the 4th metacarpal appears to be mildly comminuted and intra-articular. But is only visible on image #1. Possible small avulsion type fracture also at the base of the 5th metacarpal on that image. MCP joints  and phalanges appear intact. IMPRESSION: 1. Widespread posttraumatic soft tissue gas about the hand and wrist. 2. Mildly comminuted and intra-articular fracture at the base of the 4th metacarpal. And suspected small avulsion type fracture at the base of the 5th metacarpal. Electronically Signed   By: Odessa Fleming M.D.   On: 05/30/2021 11:49    Procedures Procedures   Medications Ordered in ED Medications  ceFAZolin (ANCEF) IVPB 2g/100 mL premix (2 g Intravenous New Bag/Given 05/30/21 1231)  fentaNYL (SUBLIMAZE) injection 50 mcg (50 mcg Intravenous Given 05/30/21 1228)  ED Course  I have reviewed the triage vital signs and the nursing notes.  Pertinent labs & imaging results that were available during my care of the patient were reviewed by me and considered in my medical decision making (see chart for details).    MDM Rules/Calculators/A&P                           Patient presenting today after an industrial injury to his hand where it got stuck in a roller machine.  He has significant swelling and open wound between his first and second digits as well as significant swelling to the wrist.  Plain image of the hand shows what spread posttraumatic soft tissue gas about the hand and wrist as well as mildly comminuted intra-articular fracture at the base of the fourth metacarpal and small avulsion fracture of the fifth metacarpal.  Patient was given Ancef.  Wrist images are pending.  Tetanus shot is up-to-date.  Given further pain control and hand surgery to evaluate.  1:31 PM Wrist imaging is negative.  Patient will go to the OR for repair  MDM   Amount and/or Complexity of Data Reviewed Tests in the radiology section of CPT: ordered and reviewed Independent visualization of images, tracings, or specimens: yes    Final Clinical Impression(s) / ED Diagnoses Final diagnoses:  None    Rx / DC Orders ED Discharge Orders     None        Gwyneth Sprout, MD 05/30/21 1339

## 2021-05-30 NOTE — ED Notes (Signed)
Patient transported to X-ray 

## 2021-05-30 NOTE — Progress Notes (Signed)
Glennis Brink- friend picking up patient [phone]

## 2021-05-30 NOTE — ED Notes (Signed)
Pt to short stay  

## 2021-05-30 NOTE — ED Provider Notes (Signed)
Emergency Medicine Provider Triage Evaluation Note  Tyrone Hendricks , a 55 y.o. male  was evaluated in triage.  Pt complains of left hand pain after a crushing injury at work.  Reports he got his hand stuck and rollers.  No numbness  Review of Systems  Positive: Pain Negative: Numbness or tingling  Physical Exam  BP 133/75 (BP Location: Right Arm)   Pulse 60   Temp 97.7 F (36.5 C) (Oral)   Resp 18   SpO2 100%  Gen:   Awake, no distress   Resp:  Normal effort  MSK:   Moves extremities without difficulty  Other:  No tenderness to wrist.  Able to wiggle fingers, with a radial pulse  Medical Decision Making  Medically screening exam initiated at 11:07 AM.  Appropriate orders placed.  [name] was informed that the remainder of the evaluation will be completed by another provider, this initial triage assessment does not replace that evaluation, and the importance of remaining in the ED until their evaluation is complete.     Saddie Benders, PA-C 05/30/21 1108    Gwyneth Sprout, MD 06/06/21 [phone]

## 2021-05-30 NOTE — Progress Notes (Signed)
Pacu Discharge Note  Patient instuctions were given to family. Wound care, diet, pain, follow up care and how and whom to contact with concerns were discussed. Family aware someone needs to remain with patient overnight and concerns after receiving anesthesia and what to avoid and safety. Answered all questions and concerns.   Discharge paperwork has clear contact informations for surgeon and 24 hour RN line for concerns.   Discussed what concerns to look for including infection and signs/symptoms to look for.  Discussed w/ pt and friend to keep L arm/hand elevated above heart as much as possible and to wear sling when walking around to help prevent swelling. Also,advised to use icebag on/off to help with pain/swelling.   IV was removed prior to discharge. Patient was brought to car with belongings.   Pt exits my care.

## 2021-05-30 NOTE — Anesthesia Preprocedure Evaluation (Addendum)
Anesthesia Evaluation  Patient identified by MRN, date of birth, ID band Patient awake    Reviewed: Allergy & Precautions, NPO status , Patient's Chart, lab work & pertinent test results  Airway Mallampati: II  TM Distance: >3 FB Neck ROM: Full    Dental  (+) Teeth Intact, Dental Advisory Given   Pulmonary former smoker,    Pulmonary exam normal breath sounds clear to auscultation       Cardiovascular negative cardio ROS Normal cardiovascular exam Rhythm:Regular Rate:Normal     Neuro/Psych PSYCHIATRIC DISORDERS Anxiety Depression negative neurological ROS     GI/Hepatic negative GI ROS, Neg liver ROS,   Endo/Other  negative endocrine ROS  Renal/GU negative Renal ROS     Musculoskeletal negative musculoskeletal ROS (+) Left hand injury    Abdominal   Peds  Hematology negative hematology ROS (+)   Anesthesia Other Findings Day of surgery medications reviewed with the patient.  Reproductive/Obstetrics                             Anesthesia Physical Anesthesia Plan  ASA: 2 and emergent  Anesthesia Plan: General   Post-op Pain Management:    Induction: Intravenous  PONV Risk Score and Plan: 2 and Midazolam, Dexamethasone and Ondansetron  Airway Management Planned: LMA  Additional Equipment:   Intra-op Plan:   Post-operative Plan: Extubation in OR  Informed Consent: I have reviewed the patients History and Physical, chart, labs and discussed the procedure including the risks, benefits and alternatives for the proposed anesthesia with the patient or authorized representative who has indicated his/her understanding and acceptance.     Dental advisory given  Plan Discussed with: CRNA  Anesthesia Plan Comments:         Anesthesia Quick Evaluation

## 2021-05-30 NOTE — Anesthesia Postprocedure Evaluation (Signed)
Anesthesia Post Note  Patient: Tyrone Hendricks  Procedure(s) Performed: Left hand Irrigation and Debridment, closed reduction percutaneous pinning (Left: Hand)     Patient location during evaluation: PACU Anesthesia Type: General Level of consciousness: awake and alert Pain management: pain level controlled Vital Signs Assessment: post-procedure vital signs reviewed and stable Respiratory status: spontaneous breathing, nonlabored ventilation and respiratory function stable Cardiovascular status: blood pressure returned to baseline and stable Postop Assessment: no apparent nausea or vomiting Anesthetic complications: no   No notable events documented.  Last Vitals:  Vitals:   05/30/21 2120 05/30/21 2125  BP:    Pulse: 72 63  Resp: (!) 21 (!) 7  Temp:    SpO2: 100% 100%    Last Pain:  Vitals:   05/30/21 2120  TempSrc:   PainSc: 6                  Cecile Hearing

## 2021-05-30 NOTE — Anesthesia Procedure Notes (Signed)
Procedure Name: LMA Insertion Date/Time: 05/30/2021 7:41 PM Performed by: Molli Hazard, CRNA Pre-anesthesia Checklist: Patient identified, Emergency Drugs available, Suction available and Patient being monitored Patient Re-evaluated:Patient Re-evaluated prior to induction Oxygen Delivery Method: Circle system utilized Preoxygenation: Pre-oxygenation with 100% oxygen Induction Type: IV induction Ventilation: Mask ventilation without difficulty LMA: LMA inserted LMA Size: 4.0 Number of attempts: 1 Placement Confirmation: positive ETCO2 Tube secured with: Tape Dental Injury: Teeth and Oropharynx as per pre-operative assessment

## 2021-05-30 NOTE — Op Note (Signed)
PREOPERATIVE DIAGNOSIS: Left hand crush injury Left small finger metacarpal base fracture Left ring finger metacarpal base fracture  POSTOPERATIVE DIAGNOSIS: Same  ATTENDING SURGEON: Dr. Bradly Bienenstock who scrubbed and present for the entire procedure  ASSISTANT SURGEON: None  ANESTHESIA: General via laryngeal mask airway  OPERATIVE PROCEDURE: Debridement of skin subcutaneous tissue muscle associated with crush injury of the left hand excisional debridement Percutaneous skeletal fixation of an unstable small finger metacarpal base fracture involving the [facility] joint left hand Percutaneous skeletal fixation of unstable ring finger metacarpal base fracture involving the [facility] joint left hand Radiographs 3 views left hand Traumatic laceration repair left hand 5 cm  IMPLANTS: Four 0.045 K wires  EBL: Minimal  RADIOGRAPHIC INTERPRETATION: AP lateral and oblique views of the hand do show the K wire fixation in place across the small and ring finger metacarpals into the long finger metacarpal with good alignment of the CMC joints  SURGICAL INDICATIONS: Patient is a right-hand-dominant gentleman who sustained a crushing injury to his left hand.  Patient was seen and evaluated and recommended undergo the above procedure.  Risks of surgery include but not limited to bleeding infection damage nearby nerves arteries or tendons loss of motion of the wrist and digits incomplete relief of symptoms and need for further surgical invention.  Signed informed consent was obtained the day of surgery.  SURGICAL TECHNIQUE: Patient was palpated via the preoperative holding area marked apart a marker made on the left hand indicate correct operative site.  Patient brought back to operating placed supine on the anesthesia table where the general anesthetic was administered.  Patient tolerated this well.  A well-padded tourniquet placed on the left brachium and sealed with the appropriate drape.  The left upper extremities  then prepped and draped normal sterile fashion.  A timeout was called the correct site was identified procedure then begun.  Attention was then turned to the left hand excisional debridement was then carried out of the wound over the webspace between the thumb and index finger over the course of the adductor pollicis. Debridement type: Excisional Debridement  Side: left  Body Location: Left hand   Tools used for debridement: scalpel and scissors  Pre-debridement Wound size (cm):   Length: 5         Width: 1     Depth: 3   Post-debridement Wound size (cm):   Length: 6       Width: 1     Depth: 3   Debridement depth beyond dead/damaged tissue down to healthy viable tissue: yes  Tissue layer involved: skin, subcutaneous tissue, muscle / fascia  Nature of tissue removed: Devitalized Tissue  Irrigation volume: 1 Liter     Irrigation fluid type: Normal Saline   Portion of the abductor was excised and the muscle belly then had partially been extruded.  The patient still had the attachment across the index metacarpal to the thumb metacarpal phalangeal joint region.  The wound was thoroughly irrigated.  Following this the skin was loosely reapproximated and closed with simple Prolene sutures.  Attention was then turned to the left hand for close manipulation was unsuccessful the small and the ring finger metacarpal base fractures following this 2 transverse K wires were then placed from the ring finger metacarpal into the small finger metacarpal.  An additional K wire was then placed from the small finger metacarpal into the hamate across the [facility] joint and 1 finally across the base of the small finger into the ring finger and  long finger metacarpal bases to stabilize the fractures.  Final x-rays were then obtained.  The K wires were then cut Xeroform bolster dressing was then applied.  Sterile compressive bandage then applied.  The patient was placed in a well-padded volar splint tourniquet deflated  there is good perfusion the fingers patient was then extubated taken recovery room in good condition.   POSTOPERATIVE PLAN: Patient be discharged to home.  See him back in the office in 9 days for x-rays out of the splint application of a short arm cast.  We will also place the therapy order to begin therapy with the crushing injury my concern is for the intrinsic injury to the hand and potential intrinsic tightness and limitations in his digital mobility so we will still get him moving with the pins in place and even in the cast.  Radiographs at each visit.  Pins out at the 4-week mark.

## 2021-05-30 NOTE — Consult Note (Addendum)
Reason for Consult:Hand injury Referring Physician: Gwyneth Sprout Time called: 1218 Time at bedside: 1232   Tyrone Hendricks is an 55 y.o. male.  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 He had immediate pain and a wound and was brought to the ED for evaluation. X-rays showed 4th and 5th MC fxs and hand surgery was consulted. He is RHD.  Past Medical History:  Diagnosis Date   Allergy    Anxiety    Depression    Hemorrhoids    Unspecified constipation     Past Surgical History:  Procedure Laterality Date   unremarkable      Family History  Problem Relation Age of Onset   Stomach cancer Father    Colon cancer Neg Hx    Colon polyps Neg Hx     Social History:  reports that he quit smoking about 14 years ago. He has never used smokeless tobacco. He reports that he does not drink alcohol and does not use drugs.  Allergies:  Allergies  Allergen Reactions   Other Other (See Comments)    sneezing    Medications: I have reviewed the patient's current medications.  No results found for this or any previous visit (from the past 48 hour(s)).  DG Hand Complete Left  Result Date: 05/30/2021 CLINICAL DATA:  55 year old male with crush injury. EXAM: LEFT HAND - COMPLETE 3+ VIEW COMPARISON:  Left finger series 07/03/2012. FINDINGS: Widespread abnormal soft tissue gas about the hand and wrist. Distal radius and ulna appear to remain intact. Carpal bone alignment maintained. Fracture at the base of the 4th metacarpal appears to be mildly comminuted and intra-articular. But is only visible on image #1. Possible small avulsion type fracture also at the base of the 5th metacarpal on that image. MCP joints and phalanges appear intact. IMPRESSION: 1. Widespread posttraumatic soft tissue gas about the hand and wrist. 2. Mildly comminuted and intra-articular fracture at the base of the 4th metacarpal. And suspected small avulsion  type fracture at the base of the 5th metacarpal. Electronically Signed   By: Odessa Fleming M.D.   On: 05/30/2021 11:49    Review of Systems  HENT:  Negative for ear discharge, ear pain, hearing loss and tinnitus.   Eyes:  Negative for photophobia and pain.  Respiratory:  Negative for cough and shortness of breath.   Cardiovascular:  Negative for chest pain.  Gastrointestinal:  Negative for abdominal pain, nausea and vomiting.  Genitourinary:  Negative for dysuria, flank pain, frequency and urgency.  Musculoskeletal:  Positive for arthralgias (Left hand). Negative for back pain, myalgias and neck pain.  Neurological:  Negative for dizziness and headaches.  Hematological:  Does not bruise/bleed easily.  Psychiatric/Behavioral:  The patient is not nervous/anxious.   Blood pressure 130/80, pulse 67, temperature 97.7 F (36.5 C), temperature source Oral, resp. rate 19, SpO2 100 %. Physical Exam Constitutional:      General: He is not in acute distress.    Appearance: He is well-developed. He is not diaphoretic.  HENT:     Head: Normocephalic and atraumatic.  Eyes:     General: No scleral icterus.       Right eye: No discharge.        Left eye: No discharge.     Conjunctiva/sclera: Conjunctivae normal.  Cardiovascular:     Rate and Rhythm: Normal rate and regular rhythm.  Pulmonary:     Effort: Pulmonary effort is  normal. No respiratory distress.  Musculoskeletal:     Cervical back: Normal range of motion.     Comments: UEx shoulder, elbow, wrist, digits- Muscle belly herniated through base of thumb, hand TTP, no instability, no blocks to motion  Sens  Ax/R/M/U intact  Mot   Ax/ R/ PIN/ M/ AIN/ U intact  Rad 2+  Skin:    General: Skin is warm and dry.  Neurological:     Mental Status: He is alert.  Psychiatric:        Mood and Affect: Mood normal.        Behavior: Behavior normal.    Assessment/Plan: Left hand injury -- Will need OR for I&D. Please keep NPO. Anticipate discharge  after surgery.  PT SEEN/EXAMINED PLAN FOR LEFT HAND DEBRIDEMENT AND REPAIR AND PINNING AS INDICATED PT VOICED UNDERSTANDING OF PLAN  R/B/A DISCUSSED WITH PT IN HOSPITAL.  PT VOICED UNDERSTANDING OF PLAN CONSENT SIGNED DAY OF SURGERY PT SEEN AND EXAMINED PRIOR TO OPERATIVE PROCEDURE/DAY OF SURGERY SITE MARKED. QUESTIONS ANSWERED WILL GO HOME FOLLOWING SURGERY   Freeman Caldron, PA-C Orthopedic Surgery [phone] 05/30/2021, 12:41 PM

## 2021-05-30 NOTE — Discharge Instructions (Signed)
KEEP BANDAGE CLEAN AND DRY °CALL OFFICE FOR F/U APPT [phone] in 9 days °KEEP HAND ELEVATED ABOVE HEART °OK TO APPLY ICE TO OPERATIVE AREA °CONTACT OFFICE IF ANY WORSENING PAIN OR CONCERNS. °

## 2021-05-31 ENCOUNTER — Encounter (HOSPITAL_COMMUNITY): Payer: Self-pay | Admitting: Orthopedic Surgery

## 2021-06-06 ENCOUNTER — Telehealth: Payer: Self-pay

## 2021-06-06 NOTE — Telephone Encounter (Signed)
Transition Care Management Unsuccessful Follow-up Telephone Call  Date of discharge and from where:  05/30/21 from [facility] Perioperative area  Attempts:  1st Attempt  Reason for unsuccessful TCM follow-up call:  Left voice message   Kathyrn Sheriff, RN, MSN, BSN, CCM [facility] Care Management Coordinator [phone]

## 2021-06-08 ENCOUNTER — Telehealth: Payer: Self-pay

## 2021-06-08 NOTE — Telephone Encounter (Signed)
Transition Care Management Follow-up Telephone Call Date of discharge and from where: 05/30/2021  [facility] How have you been since you were released from the hospital? "Still having pain" Any questions or concerns? No  Items Reviewed: Did the pt receive and understand the discharge instructions provided? Yes  Medications obtained and verified? Yes  Other? No  Any new allergies since your discharge? No  Dietary orders reviewed? Yes Do you have support at home? Yes   Home Care and Equipment/Supplies: Were home health services ordered? no If so, what is the name of the agency?  Has the agency set up a time to come to the patient's home?  Were any new equipment or medical supplies ordered?   What is the name of the medical supply agency?  Were you able to get the supplies/equipment?  Do you have any questions related to the use of the equipment or supplies?   Functional Questionnaire: (I = Independent and D = Dependent) ADLs: I  Bathing/Dressing- I  Meal Prep- I  Eating- I  Maintaining continence- I  Transferring/Ambulation- I  Managing Meds- I  Follow up appointments reviewed:  PCP Hospital f/u appt confirmed? No   Specialist Hospital f/u appt confirmed? Yes  Scheduled to see ORTHO  on TODAY Are transportation arrangements needed? No  If their condition worsens, is the pt aware to call PCP or go to the Emergency Dept.? Yes Was the patient provided with contact information for the PCP's office or ED? Yes Was to pt encouraged to call back with questions or concerns? Yes  Rowe Pavy RN, BSN, CEN RN Case Manager - [facility] Mobile: [phone]

## 2021-11-15 ENCOUNTER — Other Ambulatory Visit: Payer: Self-pay | Admitting: Emergency Medicine

## 2021-11-15 DIAGNOSIS — J3089 Other allergic rhinitis: Secondary | ICD-10-CM

## 2022-03-06 ENCOUNTER — Other Ambulatory Visit: Payer: Self-pay | Admitting: Emergency Medicine

## 2022-03-06 DIAGNOSIS — J3089 Other allergic rhinitis: Secondary | ICD-10-CM

## 2022-05-08 ENCOUNTER — Encounter: Payer: Self-pay | Admitting: Emergency Medicine

## 2022-05-08 ENCOUNTER — Ambulatory Visit (INDEPENDENT_AMBULATORY_CARE_PROVIDER_SITE_OTHER): Payer: Managed Care, Other (non HMO) | Admitting: Emergency Medicine

## 2022-05-08 VITALS — BP 110/74 | HR 59 | Temp 97.9°F | Ht 68.0 in | Wt 162.1 lb

## 2022-05-08 DIAGNOSIS — Z130 Encounter for screening for diseases of the blood and blood-forming organs and certain disorders involving the immune mechanism: Secondary | ICD-10-CM

## 2022-05-08 DIAGNOSIS — Z1329 Encounter for screening for other suspected endocrine disorder: Secondary | ICD-10-CM

## 2022-05-08 DIAGNOSIS — Z13220 Encounter for screening for lipoid disorders: Secondary | ICD-10-CM | POA: Diagnosis not present

## 2022-05-08 DIAGNOSIS — Z0000 Encounter for general adult medical examination without abnormal findings: Secondary | ICD-10-CM | POA: Diagnosis not present

## 2022-05-08 DIAGNOSIS — Z13228 Encounter for screening for other metabolic disorders: Secondary | ICD-10-CM | POA: Diagnosis not present

## 2022-05-08 DIAGNOSIS — Z23 Encounter for immunization: Secondary | ICD-10-CM

## 2022-05-08 DIAGNOSIS — Z298 Encounter for other specified prophylactic measures: Secondary | ICD-10-CM

## 2022-05-08 DIAGNOSIS — Z2989 Encounter for other specified prophylactic measures: Secondary | ICD-10-CM

## 2022-05-08 LAB — LIPID PANEL
Cholesterol: 161 mg/dL (ref 0–200)
HDL: 47 mg/dL (ref >39.00)
LDL Cholesterol: 105 mg/dL — ABNORMAL HIGH (ref 0–99)
NonHDL: 114.47
Total CHOL/HDL Ratio: 3
Triglycerides: 49 mg/dL (ref 0.0–149.0)
VLDL: 9.8 mg/dL (ref 0.0–40.0)

## 2022-05-08 LAB — COMPREHENSIVE METABOLIC PANEL
ALT: 14 U/L (ref 0–53)
AST: 20 U/L (ref 0–37)
Albumin: 4.6 g/dL (ref 3.5–5.2)
Alkaline Phosphatase: 65 U/L (ref 39–117)
BUN: 10 mg/dL (ref 6–23)
CO2: 30 mEq/L (ref 19–32)
Calcium: 9.4 mg/dL (ref 8.4–10.5)
Chloride: 103 mEq/L (ref 96–112)
Creatinine, Ser: 1.01 mg/dL (ref 0.40–1.50)
GFR: 84.26 mL/min (ref >60.00)
Glucose, Bld: 90 mg/dL (ref 70–99)
Potassium: 4 mEq/L (ref 3.5–5.1)
Sodium: 137 mEq/L (ref 135–145)
Total Bilirubin: 0.7 mg/dL (ref 0.2–1.2)
Total Protein: 7.3 g/dL (ref 6.0–8.3)

## 2022-05-08 LAB — CBC WITH DIFFERENTIAL/PLATELET
Basophils Absolute: 0 10*3/uL (ref 0.0–0.1)
Basophils Relative: 0.4 % (ref 0.0–3.0)
Eosinophils Absolute: 0.1 10*3/uL (ref 0.0–0.7)
Eosinophils Relative: 3.8 % (ref 0.0–5.0)
HCT: 42.2 % (ref 39.0–52.0)
Hemoglobin: 13.4 g/dL (ref 13.0–17.0)
Lymphocytes Relative: 39.7 % (ref 12.0–46.0)
Lymphs Abs: 1.5 10*3/uL (ref 0.7–4.0)
MCHC: 31.8 g/dL (ref 30.0–36.0)
MCV: 78.7 fl (ref 78.0–100.0)
Monocytes Absolute: 0.3 10*3/uL (ref 0.1–1.0)
Monocytes Relative: 8.8 % (ref 3.0–12.0)
Neutro Abs: 1.8 10*3/uL (ref 1.4–7.7)
Neutrophils Relative %: 47.3 % (ref 43.0–77.0)
Platelets: 159 10*3/uL (ref 150.0–400.0)
RBC: 5.37 Mil/uL (ref 4.22–5.81)
RDW: 14.5 % (ref 11.5–15.5)
WBC: 3.8 10*3/uL — ABNORMAL LOW (ref 4.0–10.5)

## 2022-05-08 LAB — HEMOGLOBIN A1C: Hgb A1c MFr Bld: 5.6 % (ref 4.6–6.5)

## 2022-05-08 MED ORDER — ATOVAQUONE-PROGUANIL HCL 250-100 MG PO TABS: 1.0000 | ORAL_TABLET | Freq: Every day | ORAL | 1 refills | Status: DC

## 2022-05-08 NOTE — Progress Notes (Signed)
Tyrone Hendricks 56 y.o.   Chief Complaint  Patient presents with   Annual Exam    Patient is traveling out of the county in Nov and would like medications so he wont get sick while there     HISTORY OF PRESENT ILLNESS: This is a 56 y.o. male here for annual exam. Overall doing well. Has history of social anxiety.  Was able to see psychiatrist earlier this year.  Was started on BuSpar and presently taking 10 mg twice a day.  Working well for him. Will be traveling to [place] next November.  HPI   Prior to Admission medications   Medication Sig Start Date End Date Taking? Authorizing Provider  busPIRone (BUSPAR) 5 MG tablet Take 5 mg by mouth 2 (two) times daily. 05/04/21  Yes [provider]  fluticasone (FLONASE) 50 MCG/ACT nasal spray SHAKE LIQUID AND USE 2 SPRAYS IN EACH NOSTRIL DAILY AS DIRECTED 03/06/22  Yes Rosena Bartle, Ines Bloomer, MD  oxyCODONE-acetaminophen (PERCOCET) 5-325 MG tablet Take 1 tablet by mouth every 4 (four) hours as needed for severe pain. 05/30/21 05/30/22  Iran Planas, MD  fluticasone [facility]) 50 MCG/ACT nasal spray USE 2 SPRAYS IN [facility] NOSTRIL DAILY AS DIRECTED 12/13/19   Jacelyn Pi, Lilia Argue, MD    Allergies  Allergen Reactions   Lactose Intolerance (Gi) Nausea And Vomiting   Other Other (See Comments)    sneezing    Patient Active Problem List   Diagnosis Date Noted   Social anxiety disorder 02/20/2021   Non-seasonal allergic rhinitis 02/20/2021   Constipation, chronic 07/13/2012   Depression 09/21/2011    Past Medical History:  Diagnosis Date   Allergy    Anxiety    Depression    Hemorrhoids    Unspecified constipation     Past Surgical History:  Procedure Laterality Date   PERCUTANEOUS PINNING Left 05/30/2021   Procedure: Left hand Irrigation and Debridment, closed reduction percutaneous pinning;  Surgeon: Iran Planas, MD;  Location: [facility];  Service: Orthopedics;  Laterality: Left;   unremarkable      Social History    Socioeconomic History   Marital status: Married    Spouse name: Not on file   Number of children: Not on file   Years of education: Not on file   Highest education level: Not on file  Occupational History   Not on file  Tobacco Use   Smoking status: Former    Types: Cigarettes    Quit date: 11/20/2006    Years since quitting: 15.4   Smokeless tobacco: Never  Substance and Sexual Activity   Alcohol use: No   Drug use: No   Sexual activity: Yes  Other Topics Concern   Not on file  Social History Narrative   Pt from [place].  Came to [place] in 2001.       Social Determinants of Health   Financial Resource Strain: Not on file  Food Insecurity: Not on file  Transportation Needs: Not on file  Physical Activity: Not on file  Stress: Not on file  Social Connections: Not on file  Intimate Partner Violence: Not on file    Family History  Problem Relation Age of Onset   Stomach cancer Father    Colon cancer Neg Hx    Colon polyps Neg Hx      Review of Systems  Constitutional: Negative.  Negative for chills and fever.  HENT: Negative.  Negative for congestion.   Respiratory: Negative.  Negative for cough and  shortness of breath.   Cardiovascular: Negative.  Negative for chest pain.  Gastrointestinal:  Negative for abdominal pain, nausea and vomiting.  Genitourinary: Negative.   Skin: Negative.  Negative for rash.  Neurological:  Negative for dizziness and headaches.  All other systems reviewed and are negative.  Today's Vitals   05/08/22 0953  BP: 110/74  Pulse: (!) 59  Temp: 97.9 F (36.6 C)  TempSrc: Oral  SpO2: 98%  Weight: 162 lb 2 oz (73.5 kg)  Height: 5\' 8"  (1.727 m)   Body mass index is 24.65 kg/m.   Physical Exam Vitals reviewed.  Constitutional:      Appearance: Normal appearance.  HENT:     Head: Normocephalic.     Right Ear: Tympanic membrane, ear canal and external ear normal.     Left Ear: Tympanic membrane, ear canal and external ear  normal.     Mouth/Throat:     Mouth: Mucous membranes are moist.     Pharynx: Oropharynx is clear.  Eyes:     Extraocular Movements: Extraocular movements intact.     Conjunctiva/sclera: Conjunctivae normal.     Pupils: Pupils are equal, round, and reactive to light.  Cardiovascular:     Rate and Rhythm: Normal rate and regular rhythm.     Pulses: Normal pulses.     Heart sounds: Normal heart sounds.  Pulmonary:     Effort: Pulmonary effort is normal.     Breath sounds: Normal breath sounds.  Abdominal:     Palpations: Abdomen is soft.     Tenderness: There is no abdominal tenderness.  Musculoskeletal:        General: Normal range of motion.     Cervical back: No tenderness.  Lymphadenopathy:     Cervical: No cervical adenopathy.  Skin:    General: Skin is warm and dry.     Capillary Refill: Capillary refill takes less than 2 seconds.  Neurological:     General: No focal deficit present.     Mental Status: He is alert and oriented to person, place, and time.  Psychiatric:        Mood and Affect: Mood normal.        Behavior: Behavior normal.     ASSESSMENT & PLAN: Problem List Items Addressed This Visit   None Visit Diagnoses     Routine general medical examination at a health care facility    -  Primary   Screening for deficiency anemia       Relevant Orders   CBC with Differential   Screening for lipoid disorders       Relevant Orders   Lipid panel   Screening for endocrine, metabolic and immunity disorder       Relevant Orders   Comprehensive metabolic panel   Hemoglobin A1c   Need for malaria prophylaxis       Relevant Medications   atovaquone-proguanil (MALARONE) 250-100 MG TABS tablet      Modifiable risk factors discussed with patient. Anticipatory guidance according to age provided. The following topics were also discussed: Social Determinants of Health Smoking.  Non-smoker Diet and nutrition Benefits of exercise Cancer screening and review of  colonoscopy report from 2014 Vaccinations recommendations Need for antimalaria prophylaxis Cardiovascular risk assessment The 10-year ASCVD risk score (Arnett DK, et al., 2019) is: 3.1%   Values used to calculate the score:     Age: 87 years     Sex: Male     Is Non-Hispanic African American: No  Diabetic: No     Tobacco smoker: No     Systolic Blood Pressure: [id number] mmHg     Is BP treated: No     HDL Cholesterol: 49.2 mg/dL     Total Cholesterol: 162 mg/dL  Mental health including depression and anxiety Fall and accident prevention  Patient Instructions  Health Maintenance, Male Adopting a healthy lifestyle and getting preventive care are important in promoting health and wellness. Ask your health care provider about: The right schedule for you to have regular tests and exams. Things you can do on your own to prevent diseases and keep yourself healthy. What should I know about diet, weight, and exercise? Eat a healthy diet  Eat a diet that includes plenty of vegetables, fruits, low-fat dairy products, and lean protein. Do not eat a lot of foods that are high in solid fats, added sugars, or sodium. Maintain a healthy weight Body mass index (BMI) is a measurement that can be used to identify possible weight problems. It estimates body fat based on height and weight. Your health care provider can help determine your BMI and help you achieve or maintain a healthy weight. Get regular exercise Get regular exercise. This is one of the most important things you can do for your health. Most adults should: Exercise for at least 150 minutes each week. The exercise should increase your heart rate and make you sweat (moderate-intensity exercise). Do strengthening exercises at least twice a week. This is in addition to the moderate-intensity exercise. Spend less time sitting. Even light physical activity can be beneficial. Watch cholesterol and blood lipids Have your blood tested for lipids  and cholesterol at 54 years of age, then have this test every 5 years. You may need to have your cholesterol levels checked more often if: Your lipid or cholesterol levels are high. You are older than 54 years of age. You are at high risk for heart disease. What should I know about cancer screening? Many types of cancers can be detected early and may often be prevented. Depending on your health history and family history, you may need to have cancer screening at various ages. This may include screening for: Colorectal cancer. Prostate cancer. Skin cancer. Lung cancer. What should I know about heart disease, diabetes, and high blood pressure? Blood pressure and heart disease High blood pressure causes heart disease and increases the risk of stroke. This is more likely to develop in people who have high blood pressure readings or are overweight. Talk with your health care provider about your target blood pressure readings. Have your blood pressure checked: Every 3-5 years if you are 26-104 years of age. Every year if you are 58 years old or older. If you are between the ages of 25 and 46 and are a current or former smoker, ask your health care provider if you should have a one-time screening for abdominal aortic aneurysm (AAA). Diabetes Have regular diabetes screenings. This checks your fasting blood sugar level. Have the screening done: Once every three years after age 69 if you are at a normal weight and have a low risk for diabetes. More often and at a younger age if you are overweight or have a high risk for diabetes. What should I know about preventing infection? Hepatitis B If you have a higher risk for hepatitis B, you should be screened for this virus. Talk with your health care provider to find out if you are at risk for hepatitis B infection. Hepatitis C Blood  testing is recommended for: Everyone born from [birth year] through 1963. Anyone with known risk factors for hepatitis  C. Sexually transmitted infections (STIs) You should be screened each year for STIs, including gonorrhea and chlamydia, if: You are sexually active and are younger than 54 years of age. You are older than 54 years of age and your health care provider tells you that you are at risk for this type of infection. Your sexual activity has changed since you were last screened, and you are at increased risk for chlamydia or gonorrhea. Ask your health care provider if you are at risk. Ask your health care provider about whether you are at high risk for HIV. Your health care provider may recommend a prescription medicine to help prevent HIV infection. If you choose to take medicine to prevent HIV, you should first get tested for HIV. You should then be tested every 3 months for as long as you are taking the medicine. Follow these instructions at home: Alcohol use Do not drink alcohol if your health care provider tells you not to drink. If you drink alcohol: Limit how much you have to 0-2 drinks a day. Know how much alcohol is in your drink. In the [place] one drink equals one 12 oz bottle of beer (355 mL), one 5 oz glass of wine (148 mL), or one 1 oz glass of hard liquor (44 mL). Lifestyle Do not use any products that contain nicotine or tobacco. These products include cigarettes, chewing tobacco, and vaping devices, such as e-cigarettes. If you need help quitting, ask your health care provider. Do not use street drugs. Do not share needles. Ask your health care provider for help if you need support or information about quitting drugs. General instructions Schedule regular health, dental, and eye exams. Stay current with your vaccines. Tell your health care provider if: You often feel depressed. You have ever been abused or do not feel safe at home. Summary Adopting a healthy lifestyle and getting preventive care are important in promoting health and wellness. Follow your health care provider's  instructions about healthy diet, exercising, and getting tested or screened for diseases. Follow your health care provider's instructions on monitoring your cholesterol and blood pressure. This information is not intended to replace advice given to you by your health care provider. Make sure you discuss any questions you have with your health care provider. Document Revised: 12/18/2020 Document Reviewed: 12/18/2020 Elsevier Patient Education  [place], MD [facility]

## 2022-05-08 NOTE — Patient Instructions (Signed)
Health Maintenance, Male Adopting a healthy lifestyle and getting preventive care are important in promoting health and wellness. Ask your health care provider about: The right schedule for you to have regular tests and exams. Things you can do on your own to prevent diseases and keep yourself healthy. What should I know about diet, weight, and exercise? Eat a healthy diet  Eat a diet that includes plenty of vegetables, fruits, low-fat dairy products, and lean protein. Do not eat a lot of foods that are high in solid fats, added sugars, or sodium. Maintain a healthy weight Body mass index (BMI) is a measurement that can be used to identify possible weight problems. It estimates body fat based on height and weight. Your health care provider can help determine your BMI and help you achieve or maintain a healthy weight. Get regular exercise Get regular exercise. This is one of the most important things you can do for your health. Most adults should: Exercise for at least 150 minutes each week. The exercise should increase your heart rate and make you sweat (moderate-intensity exercise). Do strengthening exercises at least twice a week. This is in addition to the moderate-intensity exercise. Spend less time sitting. Even light physical activity can be beneficial. Watch cholesterol and blood lipids Have your blood tested for lipids and cholesterol at 54 years of age, then have this test every 5 years. You may need to have your cholesterol levels checked more often if: Your lipid or cholesterol levels are high. You are older than 54 years of age. You are at high risk for heart disease. What should I know about cancer screening? Many types of cancers can be detected early and may often be prevented. Depending on your health history and family history, you may need to have cancer screening at various ages. This may include screening for: Colorectal cancer. Prostate cancer. Skin cancer. Lung  cancer. What should I know about heart disease, diabetes, and high blood pressure? Blood pressure and heart disease High blood pressure causes heart disease and increases the risk of stroke. This is more likely to develop in people who have high blood pressure readings or are overweight. Talk with your health care provider about your target blood pressure readings. Have your blood pressure checked: Every 3-5 years if you are 18-39 years of age. Every year if you are 40 years old or older. If you are between the ages of 65 and 75 and are a current or former smoker, ask your health care provider if you should have a one-time screening for abdominal aortic aneurysm (AAA). Diabetes Have regular diabetes screenings. This checks your fasting blood sugar level. Have the screening done: Once every three years after age 45 if you are at a normal weight and have a low risk for diabetes. More often and at a younger age if you are overweight or have a high risk for diabetes. What should I know about preventing infection? Hepatitis B If you have a higher risk for hepatitis B, you should be screened for this virus. Talk with your health care provider to find out if you are at risk for hepatitis B infection. Hepatitis C Blood testing is recommended for: Everyone born from 1945 through 1965. Anyone with known risk factors for hepatitis C. Sexually transmitted infections (STIs) You should be screened each year for STIs, including gonorrhea and chlamydia, if: You are sexually active and are younger than 54 years of age. You are older than 54 years of age and your   health care provider tells you that you are at risk for this type of infection. Your sexual activity has changed since you were last screened, and you are at increased risk for chlamydia or gonorrhea. Ask your health care provider if you are at risk. Ask your health care provider about whether you are at high risk for HIV. Your health care provider  may recommend a prescription medicine to help prevent HIV infection. If you choose to take medicine to prevent HIV, you should first get tested for HIV. You should then be tested every 3 months for as long as you are taking the medicine. Follow these instructions at home: Alcohol use Do not drink alcohol if your health care provider tells you not to drink. If you drink alcohol: Limit how much you have to 0-2 drinks a day. Know how much alcohol is in your drink. In the [place] one drink equals one 12 oz bottle of beer (355 mL), one 5 oz glass of wine (148 mL), or one 1 oz glass of hard liquor (44 mL). Lifestyle Do not use any products that contain nicotine or tobacco. These products include cigarettes, chewing tobacco, and vaping devices, such as e-cigarettes. If you need help quitting, ask your health care provider. Do not use street drugs. Do not share needles. Ask your health care provider for help if you need support or information about quitting drugs. General instructions Schedule regular health, dental, and eye exams. Stay current with your vaccines. Tell your health care provider if: You often feel depressed. You have ever been abused or do not feel safe at home. Summary Adopting a healthy lifestyle and getting preventive care are important in promoting health and wellness. Follow your health care provider's instructions about healthy diet, exercising, and getting tested or screened for diseases. Follow your health care provider's instructions on monitoring your cholesterol and blood pressure. This information is not intended to replace advice given to you by your health care provider. Make sure you discuss any questions you have with your health care provider. Document Revised: 12/18/2020 Document Reviewed: 12/18/2020 Elsevier Patient Education  2023 [employer]  

## 2022-10-24 ENCOUNTER — Encounter: Payer: Self-pay | Admitting: Gastroenterology

## 2022-12-31 ENCOUNTER — Other Ambulatory Visit: Payer: Self-pay | Admitting: Emergency Medicine

## 2022-12-31 DIAGNOSIS — J3089 Other allergic rhinitis: Secondary | ICD-10-CM

## 2023-05-27 ENCOUNTER — Ambulatory Visit (INDEPENDENT_AMBULATORY_CARE_PROVIDER_SITE_OTHER): Payer: 59 | Admitting: Emergency Medicine

## 2023-05-27 VITALS — BP 118/78 | HR 65 | Temp 97.9°F | Ht 68.0 in | Wt 160.0 lb

## 2023-05-27 DIAGNOSIS — Z13228 Encounter for screening for other metabolic disorders: Secondary | ICD-10-CM | POA: Diagnosis not present

## 2023-05-27 DIAGNOSIS — Z0000 Encounter for general adult medical examination without abnormal findings: Secondary | ICD-10-CM

## 2023-05-27 DIAGNOSIS — Z125 Encounter for screening for malignant neoplasm of prostate: Secondary | ICD-10-CM | POA: Diagnosis not present

## 2023-05-27 DIAGNOSIS — Z1211 Encounter for screening for malignant neoplasm of colon: Secondary | ICD-10-CM

## 2023-05-27 DIAGNOSIS — Z1329 Encounter for screening for other suspected endocrine disorder: Secondary | ICD-10-CM | POA: Diagnosis not present

## 2023-05-27 DIAGNOSIS — Z130 Encounter for screening for diseases of the blood and blood-forming organs and certain disorders involving the immune mechanism: Secondary | ICD-10-CM

## 2023-05-27 DIAGNOSIS — Z13220 Encounter for screening for lipoid disorders: Secondary | ICD-10-CM | POA: Diagnosis not present

## 2023-05-27 DIAGNOSIS — Z23 Encounter for immunization: Secondary | ICD-10-CM | POA: Diagnosis not present

## 2023-05-27 LAB — CBC WITH DIFFERENTIAL/PLATELET
Basophils Absolute: 0 10*3/uL (ref 0.0–0.1)
Basophils Relative: 0.2 % (ref 0.0–3.0)
Eosinophils Absolute: 0.1 10*3/uL (ref 0.0–0.7)
Eosinophils Relative: 3.3 % (ref 0.0–5.0)
HCT: 44.9 % (ref 39.0–52.0)
Hemoglobin: 14.2 g/dL (ref 13.0–17.0)
Lymphocytes Relative: 30.9 % (ref 12.0–46.0)
Lymphs Abs: 1.2 10*3/uL (ref 0.7–4.0)
MCHC: 31.5 g/dL (ref 30.0–36.0)
MCV: 78.4 fL (ref 78.0–100.0)
Monocytes Absolute: 0.3 10*3/uL (ref 0.1–1.0)
Monocytes Relative: 8.4 % (ref 3.0–12.0)
Neutro Abs: 2.2 10*3/uL (ref 1.4–7.7)
Neutrophils Relative %: 57.2 % (ref 43.0–77.0)
Platelets: 191 10*3/uL (ref 150.0–400.0)
RBC: 5.73 Mil/uL (ref 4.22–5.81)
RDW: 14.7 % (ref 11.5–15.5)
WBC: 3.9 10*3/uL — ABNORMAL LOW (ref 4.0–10.5)

## 2023-05-27 LAB — COMPREHENSIVE METABOLIC PANEL
ALT: 12 U/L (ref 0–53)
AST: 16 U/L (ref 0–37)
Albumin: 4.6 g/dL (ref 3.5–5.2)
Alkaline Phosphatase: 56 U/L (ref 39–117)
BUN: 12 mg/dL (ref 6–23)
CO2: 29 meq/L (ref 19–32)
Calcium: 9.3 mg/dL (ref 8.4–10.5)
Chloride: 105 meq/L (ref 96–112)
Creatinine, Ser: 0.91 mg/dL (ref 0.40–1.50)
GFR: 94.79 mL/min (ref >60.00)
Glucose, Bld: 80 mg/dL (ref 70–99)
Potassium: 4.1 meq/L (ref 3.5–5.1)
Sodium: 140 meq/L (ref 135–145)
Total Bilirubin: 0.6 mg/dL (ref 0.2–1.2)
Total Protein: 6.9 g/dL (ref 6.0–8.3)

## 2023-05-27 LAB — HEMOGLOBIN A1C: Hgb A1c MFr Bld: 5.5 % (ref 4.6–6.5)

## 2023-05-27 LAB — LIPID PANEL
Cholesterol: 171 mg/dL (ref 0–200)
HDL: 46.6 mg/dL (ref >39.00)
LDL Cholesterol: 105 mg/dL — ABNORMAL HIGH (ref 0–99)
NonHDL: 124.5
Total CHOL/HDL Ratio: 4
Triglycerides: 96 mg/dL (ref 0.0–149.0)
VLDL: 19.2 mg/dL (ref 0.0–40.0)

## 2023-05-27 LAB — PSA: PSA: 0.48 ng/mL (ref 0.10–4.00)

## 2023-05-27 NOTE — Patient Instructions (Signed)
Health Maintenance, Male Adopting a healthy lifestyle and getting preventive care are important in promoting health and wellness. Ask your health care provider about: The right schedule for you to have regular tests and exams. Things you can do on your own to prevent diseases and keep yourself healthy. What should I know about diet, weight, and exercise? Eat a healthy diet  Eat a diet that includes plenty of vegetables, fruits, low-fat dairy products, and lean protein. Do not eat a lot of foods that are high in solid fats, added sugars, or sodium. Maintain a healthy weight Body mass index (BMI) is a measurement that can be used to identify possible weight problems. It estimates body fat based on height and weight. Your health care provider can help determine your BMI and help you achieve or maintain a healthy weight. Get regular exercise Get regular exercise. This is one of the most important things you can do for your health. Most adults should: Exercise for at least 150 minutes each week. The exercise should increase your heart rate and make you sweat (moderate-intensity exercise). Do strengthening exercises at least twice a week. This is in addition to the moderate-intensity exercise. Spend less time sitting. Even light physical activity can be beneficial. Watch cholesterol and blood lipids Have your blood tested for lipids and cholesterol at 55 years of age, then have this test every 5 years. You may need to have your cholesterol levels checked more often if: Your lipid or cholesterol levels are high. You are older than 55 years of age. You are at high risk for heart disease. What should I know about cancer screening? Many types of cancers can be detected early and may often be prevented. Depending on your health history and family history, you may need to have cancer screening at various ages. This may include screening for: Colorectal cancer. Prostate cancer. Skin cancer. Lung  cancer. What should I know about heart disease, diabetes, and high blood pressure? Blood pressure and heart disease High blood pressure causes heart disease and increases the risk of stroke. This is more likely to develop in people who have high blood pressure readings or are overweight. Talk with your health care provider about your target blood pressure readings. Have your blood pressure checked: Every 3-5 years if you are 18-39 years of age. Every year if you are 40 years old or older. If you are between the ages of 65 and 75 and are a current or former smoker, ask your health care provider if you should have a one-time screening for abdominal aortic aneurysm (AAA). Diabetes Have regular diabetes screenings. This checks your fasting blood sugar level. Have the screening done: Once every three years after age 45 if you are at a normal weight and have a low risk for diabetes. More often and at a younger age if you are overweight or have a high risk for diabetes. What should I know about preventing infection? Hepatitis B If you have a higher risk for hepatitis B, you should be screened for this virus. Talk with your health care provider to find out if you are at risk for hepatitis B infection. Hepatitis C Blood testing is recommended for: Everyone born from 1945 through 1965. Anyone with known risk factors for hepatitis C. Sexually transmitted infections (STIs) You should be screened each year for STIs, including gonorrhea and chlamydia, if: You are sexually active and are younger than 55 years of age. You are older than 55 years of age and your   health care provider tells you that you are at risk for this type of infection. Your sexual activity has changed since you were last screened, and you are at increased risk for chlamydia or gonorrhea. Ask your health care provider if you are at risk. Ask your health care provider about whether you are at high risk for HIV. Your health care provider  may recommend a prescription medicine to help prevent HIV infection. If you choose to take medicine to prevent HIV, you should first get tested for HIV. You should then be tested every 3 months for as long as you are taking the medicine. Follow these instructions at home: Alcohol use Do not drink alcohol if your health care provider tells you not to drink. If you drink alcohol: Limit how much you have to 0-2 drinks a day. Know how much alcohol is in your drink. In the [place] one drink equals one 12 oz bottle of beer (355 mL), one 5 oz glass of wine (148 mL), or one 1 oz glass of hard liquor (44 mL). Lifestyle Do not use any products that contain nicotine or tobacco. These products include cigarettes, chewing tobacco, and vaping devices, such as e-cigarettes. If you need help quitting, ask your health care provider. Do not use street drugs. Do not share needles. Ask your health care provider for help if you need support or information about quitting drugs. General instructions Schedule regular health, dental, and eye exams. Stay current with your vaccines. Tell your health care provider if: You often feel depressed. You have ever been abused or do not feel safe at home. Summary Adopting a healthy lifestyle and getting preventive care are important in promoting health and wellness. Follow your health care provider's instructions about healthy diet, exercising, and getting tested or screened for diseases. Follow your health care provider's instructions on monitoring your cholesterol and blood pressure. This information is not intended to replace advice given to you by your health care provider. Make sure you discuss any questions you have with your health care provider. Document Revised: 12/18/2020 Document Reviewed: 12/18/2020 Elsevier Patient Education  2024 Elsevier Inc.  

## 2023-05-27 NOTE — Progress Notes (Signed)
Tyrone Hendricks 57 y.o.   Chief Complaint  Patient presents with   Annual Exam    Annual exam    HISTORY OF PRESENT ILLNESS: This is a 57 y.o. male here for annual exam. Healthy male with a healthy lifestyle Has no complaints or medical concerns today.  HPI   Prior to Admission medications   Medication Sig Start Date End Date Taking? Authorizing Provider  atovaquone-proguanil (MALARONE) 250-100 MG TABS tablet Take 1 tablet by mouth daily. Start medication 2 days before traveling, daily while in [place], and for 1 week after returning. Patient not taking: Reported on 05/27/2023 05/08/22   Georgina Quint, MD  busPIRone (BUSPAR) 5 MG tablet Take 5 mg by mouth 2 (two) times daily. Patient not taking: Reported on 05/27/2023 05/04/21   [provider]  fluticasone (FLONASE) 50 MCG/ACT nasal spray SHAKE LIQUID AND USE 2 SPRAYS IN EACH NOSTRIL DAILY AS DIRECTED Patient not taking: Reported on 05/27/2023 12/31/22   Georgina Quint, MD  fluticasone [facility]) 50 MCG/ACT nasal spray USE 2 SPRAYS IN [facility] NOSTRIL DAILY AS DIRECTED 12/13/19   Lezlie Lye, Meda Coffee, MD    Allergies  Allergen Reactions   Lactose Intolerance (Gi) Nausea And Vomiting   Other Other (See Comments)    Flonase, sneezing    Patient Active Problem List   Diagnosis Date Noted   Social anxiety disorder 02/20/2021   Non-seasonal allergic rhinitis 02/20/2021   Constipation, chronic 07/13/2012   Depression 09/21/2011    Past Medical History:  Diagnosis Date   Allergy    Anxiety    Depression    Hemorrhoids    Unspecified constipation     Past Surgical History:  Procedure Laterality Date   PERCUTANEOUS PINNING Left 05/30/2021   Procedure: Left hand Irrigation and Debridment, closed reduction percutaneous pinning;  Surgeon: Bradly Bienenstock, MD;  Location: [facility] OR;  Service: Orthopedics;  Laterality: Left;   unremarkable      Social History   Socioeconomic History   Marital status: Married     Spouse name: Not on file   Number of children: Not on file   Years of education: Not on file   Highest education level: Bachelor's degree (e.g., BA, AB, BS)  Occupational History   Not on file  Tobacco Use   Smoking status: Former    Current packs/day: 0.00    Types: Cigarettes    Quit date: 11/20/2006    Years since quitting: 16.5   Smokeless tobacco: Never  Substance and Sexual Activity   Alcohol use: No   Drug use: No   Sexual activity: Yes  Other Topics Concern   Not on file  Social History Narrative   Pt from [place].  Came to [place] in 2001.       Social Determinants of Health   Financial Resource Strain: Low Risk  (05/27/2023)   Overall Financial Resource Strain (CARDIA)    Difficulty of Paying Living Expenses: Not very hard  Food Insecurity: No Food Insecurity (05/27/2023)   Hunger Vital Sign    Worried About Running Out of Food in the Last Year: Never true    Ran Out of Food in the Last Year: Never true  Transportation Needs: No Transportation Needs (05/27/2023)   PRAPARE - Administrator, Civil Service (Medical): No    Lack of Transportation (Non-Medical): No  Physical Activity: Unknown (05/27/2023)   Exercise Vital Sign    Days of Exercise per Week: 0 days  Minutes of Exercise per Session: Not on file  Stress: Stress Concern Present (05/27/2023)   [employer] of Occupational Health - Occupational Stress Questionnaire    Feeling of Stress : To some extent  Social Connections: Moderately Isolated (05/27/2023)   Social Connection and Isolation Panel [NHANES]    Frequency of Communication with Friends and Family: Twice a week    Frequency of Social Gatherings with Friends and Family: More than three times a week    Attends Religious Services: Never    Database administrator or Organizations: No    Attends Engineer, structural: Not on file    Marital Status: Married  Catering manager Violence: Not on file    Family History   Problem Relation Age of Onset   Stomach cancer Father    Colon cancer Neg Hx    Colon polyps Neg Hx      Review of Systems  Constitutional: Negative.  Negative for chills and fever.  HENT: Negative.  Negative for congestion and sore throat.   Respiratory: Negative.  Negative for cough and shortness of breath.   Cardiovascular: Negative.  Negative for chest pain and palpitations.  Gastrointestinal:  Negative for abdominal pain, diarrhea, nausea and vomiting.  Genitourinary: Negative.  Negative for dysuria and hematuria.  Skin: Negative.  Negative for rash.  Neurological: Negative.  Negative for dizziness and headaches.  All other systems reviewed and are negative.   Vitals:   05/27/23 0940  BP: 118/78  Pulse: 65  Temp: 97.9 F (36.6 C)  SpO2: 96%    Physical Exam Vitals reviewed.  Constitutional:      Appearance: Normal appearance.  HENT:     Head: Normocephalic.     Right Ear: Tympanic membrane, ear canal and external ear normal.     Left Ear: Tympanic membrane, ear canal and external ear normal.     Mouth/Throat:     Mouth: Mucous membranes are moist.     Pharynx: Oropharynx is clear.  Eyes:     Extraocular Movements: Extraocular movements intact.     Conjunctiva/sclera: Conjunctivae normal.     Pupils: Pupils are equal, round, and reactive to light.  Cardiovascular:     Rate and Rhythm: Normal rate and regular rhythm.     Pulses: Normal pulses.     Heart sounds: Normal heart sounds.  Pulmonary:     Effort: Pulmonary effort is normal.     Breath sounds: Normal breath sounds.  Abdominal:     Palpations: Abdomen is soft.     Tenderness: There is no abdominal tenderness.  Musculoskeletal:     Cervical back: No tenderness.  Lymphadenopathy:     Cervical: No cervical adenopathy.  Skin:    General: Skin is warm and dry.     Capillary Refill: Capillary refill takes less than 2 seconds.  Neurological:     General: No focal deficit present.     Mental Status:  He is alert and oriented to person, place, and time.  Psychiatric:        Mood and Affect: Mood normal.        Behavior: Behavior normal.      ASSESSMENT & PLAN: Problem List Items Addressed This Visit   None Visit Diagnoses     Routine general medical examination at a health care facility    -  Primary   Relevant Orders   CBC with Differential   Comprehensive metabolic panel   Hemoglobin A1c   Lipid panel  PSA(Must document that pt has been informed of limitations of PSA testing.)   Screening for prostate cancer       Relevant Orders   PSA(Must document that pt has been informed of limitations of PSA testing.)   Screening for deficiency anemia       Relevant Orders   CBC with Differential   Screening for lipoid disorders       Relevant Orders   Lipid panel   Screening for endocrine, metabolic and immunity disorder       Relevant Orders   Comprehensive metabolic panel   Hemoglobin A1c   Screening for colon cancer       Relevant Orders   Ambulatory referral to Gastroenterology   Need for vaccination       Relevant Orders   Flu vaccine trivalent PF, 6mos and older(Flulaval,Afluria,Fluarix,Fluzone)      Modifiable risk factors discussed with patient. Anticipatory guidance according to age provided. The following topics were also discussed: Social Determinants of Health Smoking.  Non-smoker Diet and nutrition Benefits of exercise Cancer screening and need for colon cancer screening with colonoscopy Vaccinations reviewed and recommendations Cardiovascular risk assessment and need for blood work The 10-year ASCVD risk score (Arnett DK, et al., 2019) is: 4%   Values used to calculate the score:     Age: 41 years     Sex: Male     Is Non-Hispanic African American: No     Diabetic: No     Tobacco smoker: No     Systolic Blood Pressure: 118 mmHg     Is BP treated: No     HDL Cholesterol: 47 mg/dL     Total Cholesterol: 161 mg/dL  Mental health including  depression and anxiety Fall and accident prevention  Patient Instructions  Health Maintenance, Male Adopting a healthy lifestyle and getting preventive care are important in promoting health and wellness. Ask your health care provider about: The right schedule for you to have regular tests and exams. Things you can do on your own to prevent diseases and keep yourself healthy. What should I know about diet, weight, and exercise? Eat a healthy diet  Eat a diet that includes plenty of vegetables, fruits, low-fat dairy products, and lean protein. Do not eat a lot of foods that are high in solid fats, added sugars, or sodium. Maintain a healthy weight Body mass index (BMI) is a measurement that can be used to identify possible weight problems. It estimates body fat based on height and weight. Your health care provider can help determine your BMI and help you achieve or maintain a healthy weight. Get regular exercise Get regular exercise. This is one of the most important things you can do for your health. Most adults should: Exercise for at least 150 minutes each week. The exercise should increase your heart rate and make you sweat (moderate-intensity exercise). Do strengthening exercises at least twice a week. This is in addition to the moderate-intensity exercise. Spend less time sitting. Even light physical activity can be beneficial. Watch cholesterol and blood lipids Have your blood tested for lipids and cholesterol at 55 years of age, then have this test every 5 years. You may need to have your cholesterol levels checked more often if: Your lipid or cholesterol levels are high. You are older than 55 years of age. You are at high risk for heart disease. What should I know about cancer screening? Many types of cancers can be detected early and may often be prevented.  Depending on your health history and family history, you may need to have cancer screening at various ages. This may include  screening for: Colorectal cancer. Prostate cancer. Skin cancer. Lung cancer. What should I know about heart disease, diabetes, and high blood pressure? Blood pressure and heart disease High blood pressure causes heart disease and increases the risk of stroke. This is more likely to develop in people who have high blood pressure readings or are overweight. Talk with your health care provider about your target blood pressure readings. Have your blood pressure checked: Every 3-5 years if you are 70-61 years of age. Every year if you are 35 years old or older. If you are between the ages of 56 and 1 and are a current or former smoker, ask your health care provider if you should have a one-time screening for abdominal aortic aneurysm (AAA). Diabetes Have regular diabetes screenings. This checks your fasting blood sugar level. Have the screening done: Once every three years after age 64 if you are at a normal weight and have a low risk for diabetes. More often and at a younger age if you are overweight or have a high risk for diabetes. What should I know about preventing infection? Hepatitis B If you have a higher risk for hepatitis B, you should be screened for this virus. Talk with your health care provider to find out if you are at risk for hepatitis B infection. Hepatitis C Blood testing is recommended for: Everyone born from [birth year] through 1965. Anyone with known risk factors for hepatitis C. Sexually transmitted infections (STIs) You should be screened each year for STIs, including gonorrhea and chlamydia, if: You are sexually active and are younger than 55 years of age. You are older than 55 years of age and your health care provider tells you that you are at risk for this type of infection. Your sexual activity has changed since you were last screened, and you are at increased risk for chlamydia or gonorrhea. Ask your health care provider if you are at risk. Ask your health care  provider about whether you are at high risk for HIV. Your health care provider may recommend a prescription medicine to help prevent HIV infection. If you choose to take medicine to prevent HIV, you should first get tested for HIV. You should then be tested every 3 months for as long as you are taking the medicine. Follow these instructions at home: Alcohol use Do not drink alcohol if your health care provider tells you not to drink. If you drink alcohol: Limit how much you have to 0-2 drinks a day. Know how much alcohol is in your drink. In the [place] one drink equals one 12 oz bottle of beer (355 mL), one 5 oz glass of wine (148 mL), or one 1 oz glass of hard liquor (44 mL). Lifestyle Do not use any products that contain nicotine or tobacco. These products include cigarettes, chewing tobacco, and vaping devices, such as e-cigarettes. If you need help quitting, ask your health care provider. Do not use street drugs. Do not share needles. Ask your health care provider for help if you need support or information about quitting drugs. General instructions Schedule regular health, dental, and eye exams. Stay current with your vaccines. Tell your health care provider if: You often feel depressed. You have ever been abused or do not feel safe at home. Summary Adopting a healthy lifestyle and getting preventive care are important in promoting health and wellness. Follow  your health care provider's instructions about healthy diet, exercising, and getting tested or screened for diseases. Follow your health care provider's instructions on monitoring your cholesterol and blood pressure. This information is not intended to replace advice given to you by your health care provider. Make sure you discuss any questions you have with your health care provider. Document Revised: 12/18/2020 Document Reviewed: 12/18/2020 Elsevier Patient Education  2024 Elsevier Inc.      Edwina Barth, MD [place]  Primary Care at [facility]

## 2023-06-04 ENCOUNTER — Encounter: Payer: Self-pay | Admitting: Physician Assistant

## 2023-09-09 ENCOUNTER — Ambulatory Visit: Payer: 59 | Admitting: Physician Assistant

## 2023-09-30 IMAGING — DX DG WRIST COMPLETE 3+V*L*
4 series · 4 of 4 positions shown · non-contrast
Comparison: None.

CLINICAL DATA: Crushing injury, pain

EXAM:
LEFT WRIST - COMPLETE 3+ VIEW

[wrist pa]
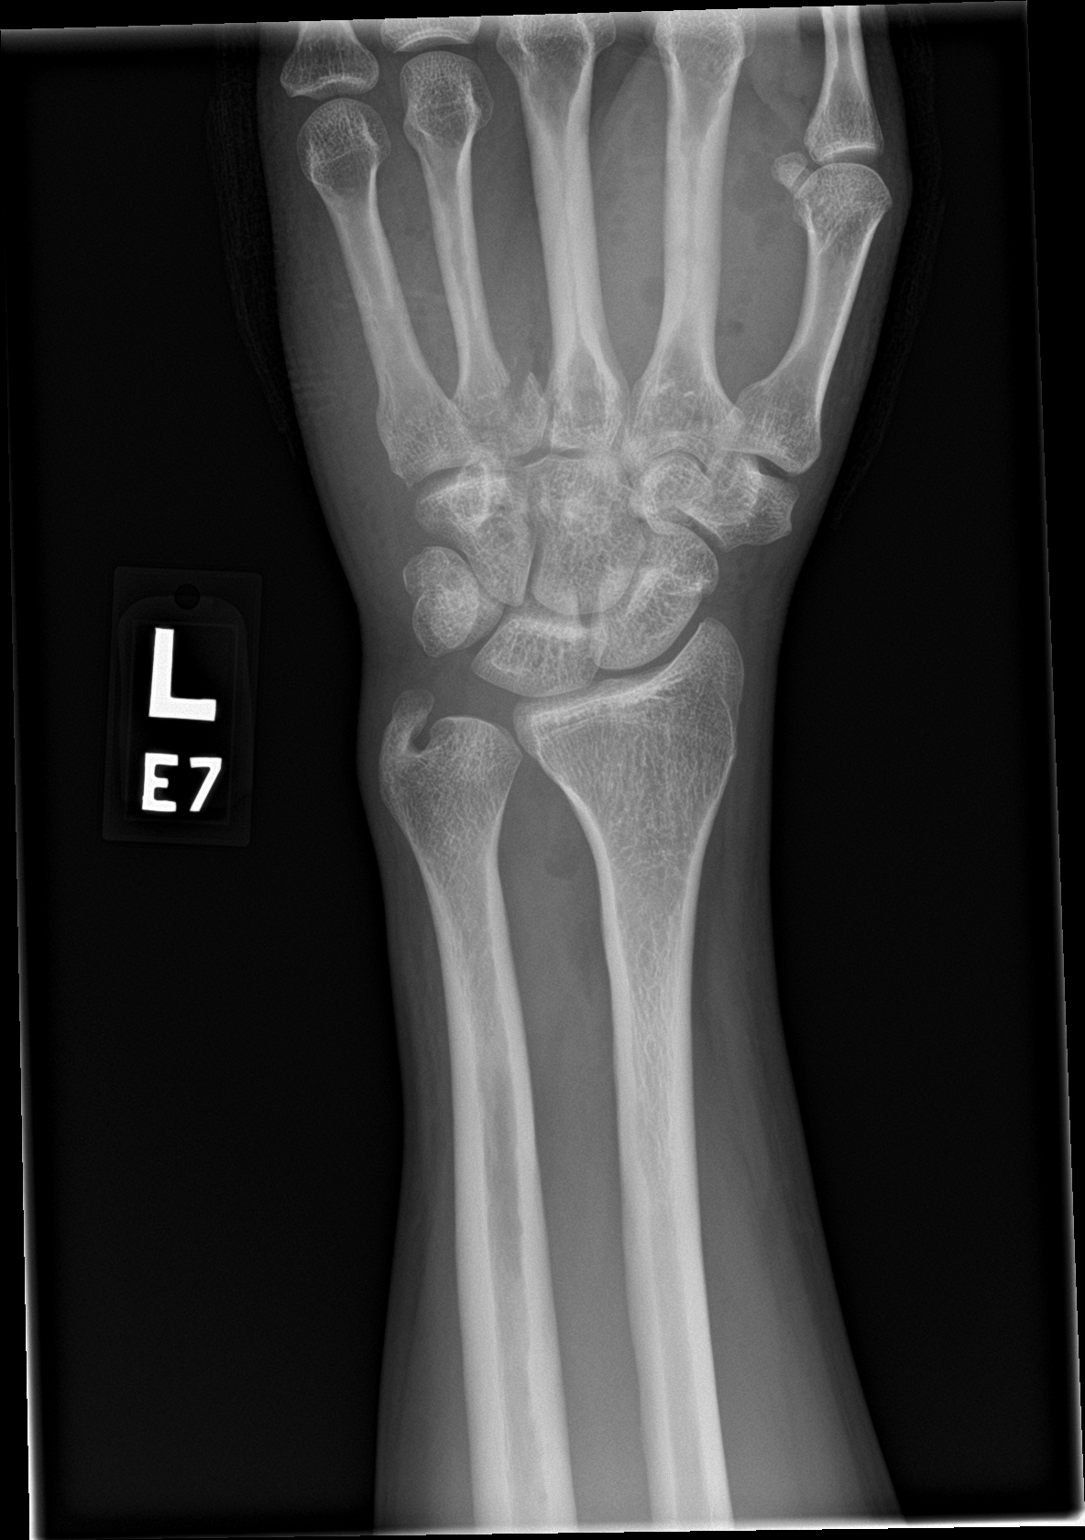

[wrist obl]
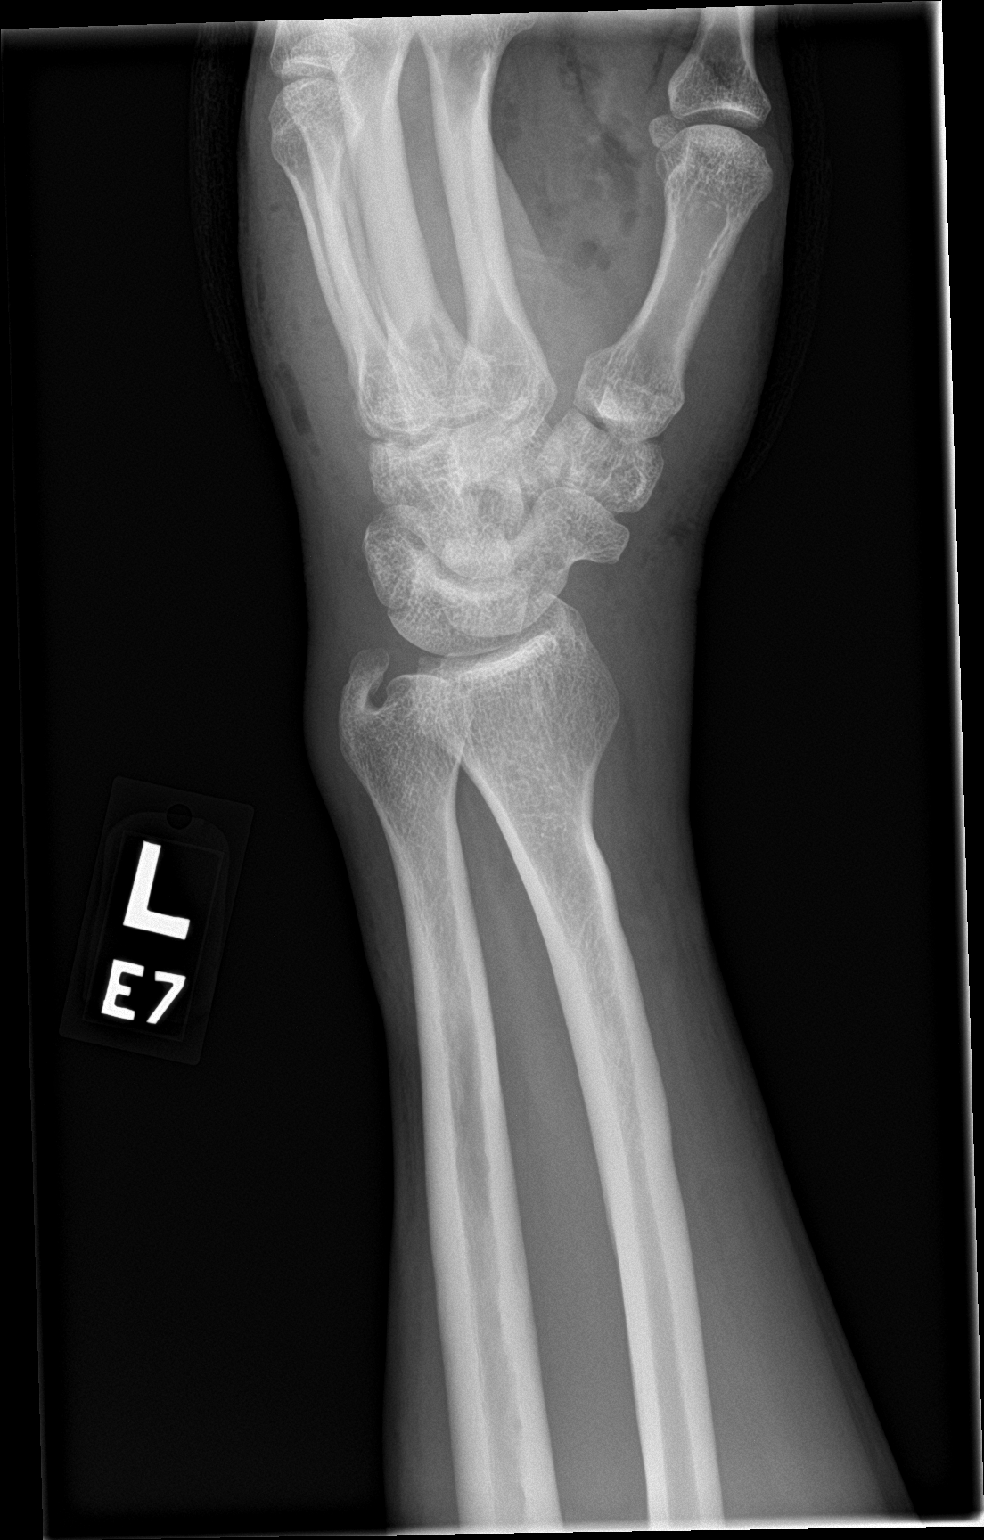

[wrist lat]
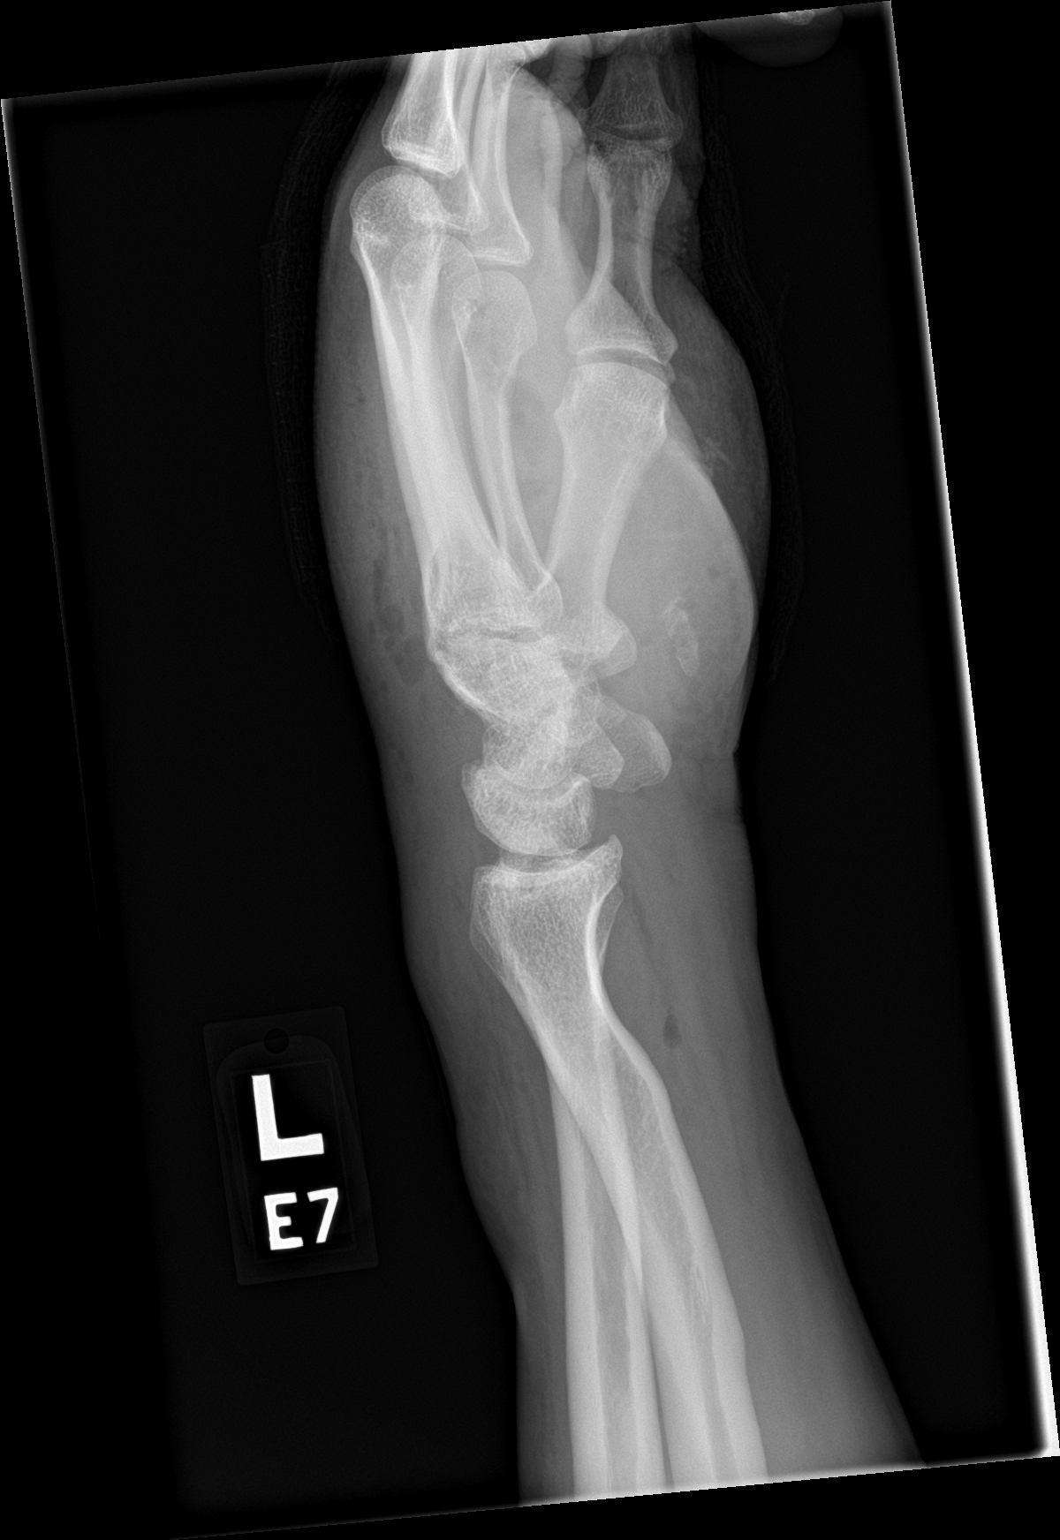

[wrist navicular]
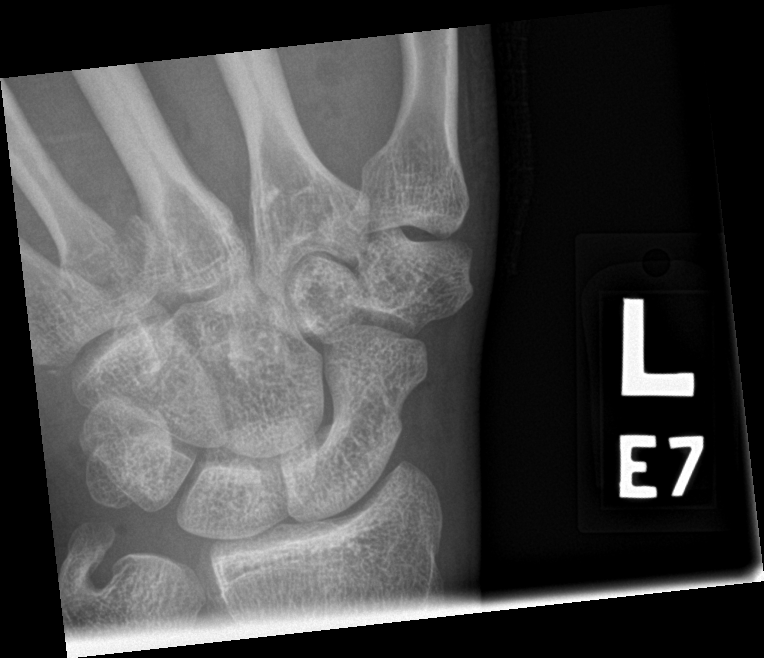

[4 of 4 positions shown; findings below may reference images not displayed]

FINDINGS: No fracture or dislocation of the left wrist. The carpus is normally
aligned. Joint spaces are preserved. There are mildly displaced
fractures of the bases of the left fourth and fifth metacarpals.
Diffuse soft tissue edema about the wrist and dorsum of the hand.
Subcutaneous gas about the dorsum the hand.
IMPRESSION: 1. No fracture or dislocation of the left wrist proper. The carpus
is normally aligned.
2. Mildly displaced fractures of the bases of the left fourth and
fifth metacarpals however better evaluated by dedicated radiographs
of the hand.
3. Diffuse soft tissue edema about the wrist and dorsum of the hand.
Subcutaneous gas about the dorsum of the hand.

## 2023-09-30 IMAGING — DX DG HAND COMPLETE 3+V*L*
3 series · 3 of 3 positions shown · non-contrast
Comparison: Left finger series 07/03/2012.

CLINICAL DATA: 55-year-old male with crush injury.

EXAM:
LEFT HAND - COMPLETE 3+ VIEW

[hand pa]
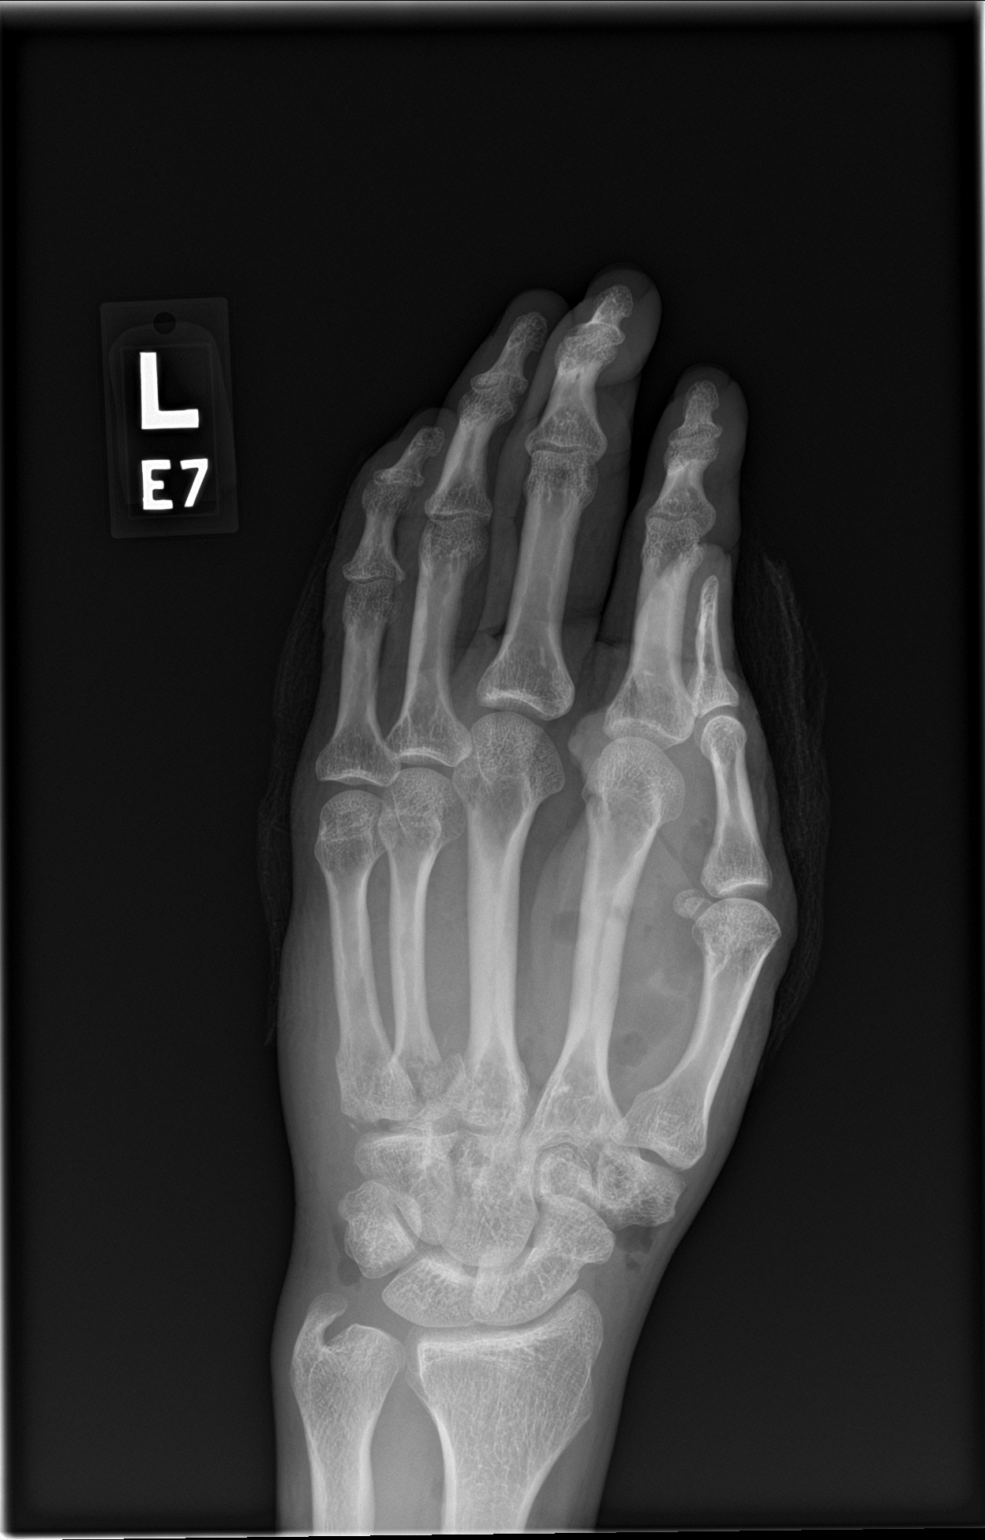

[hand obl]
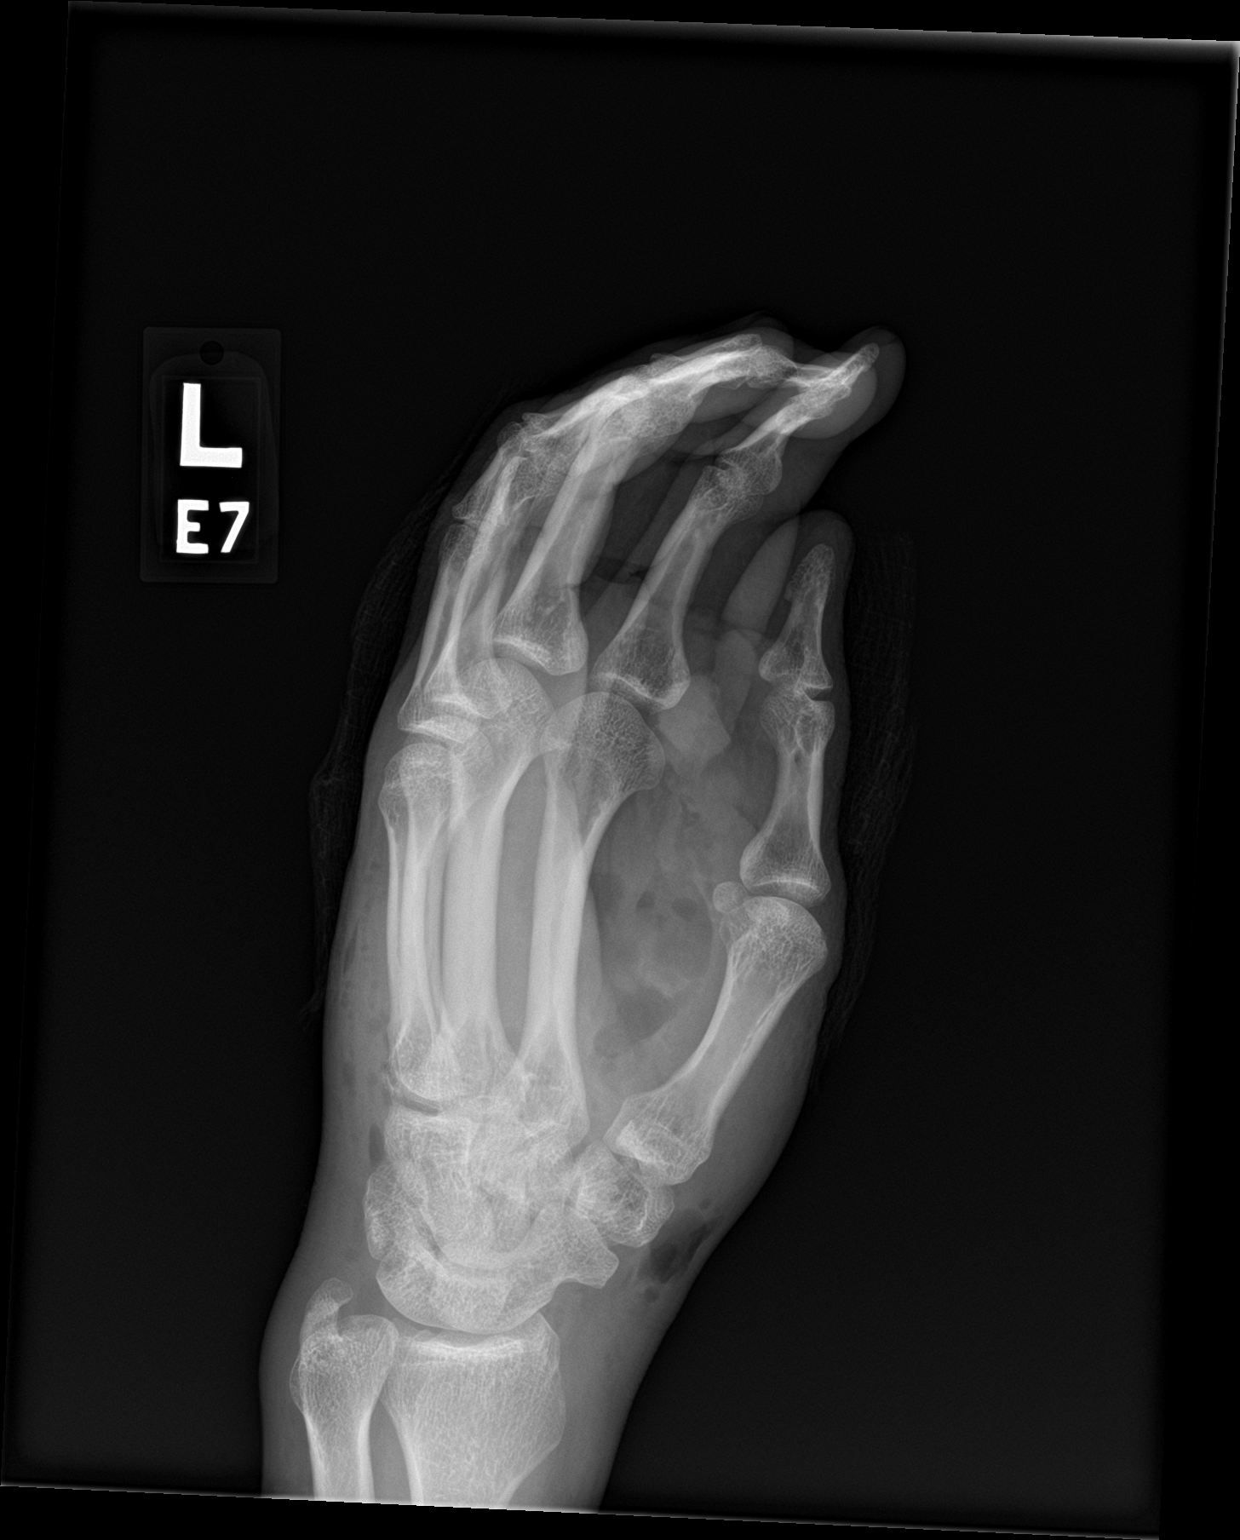

[hand lat]
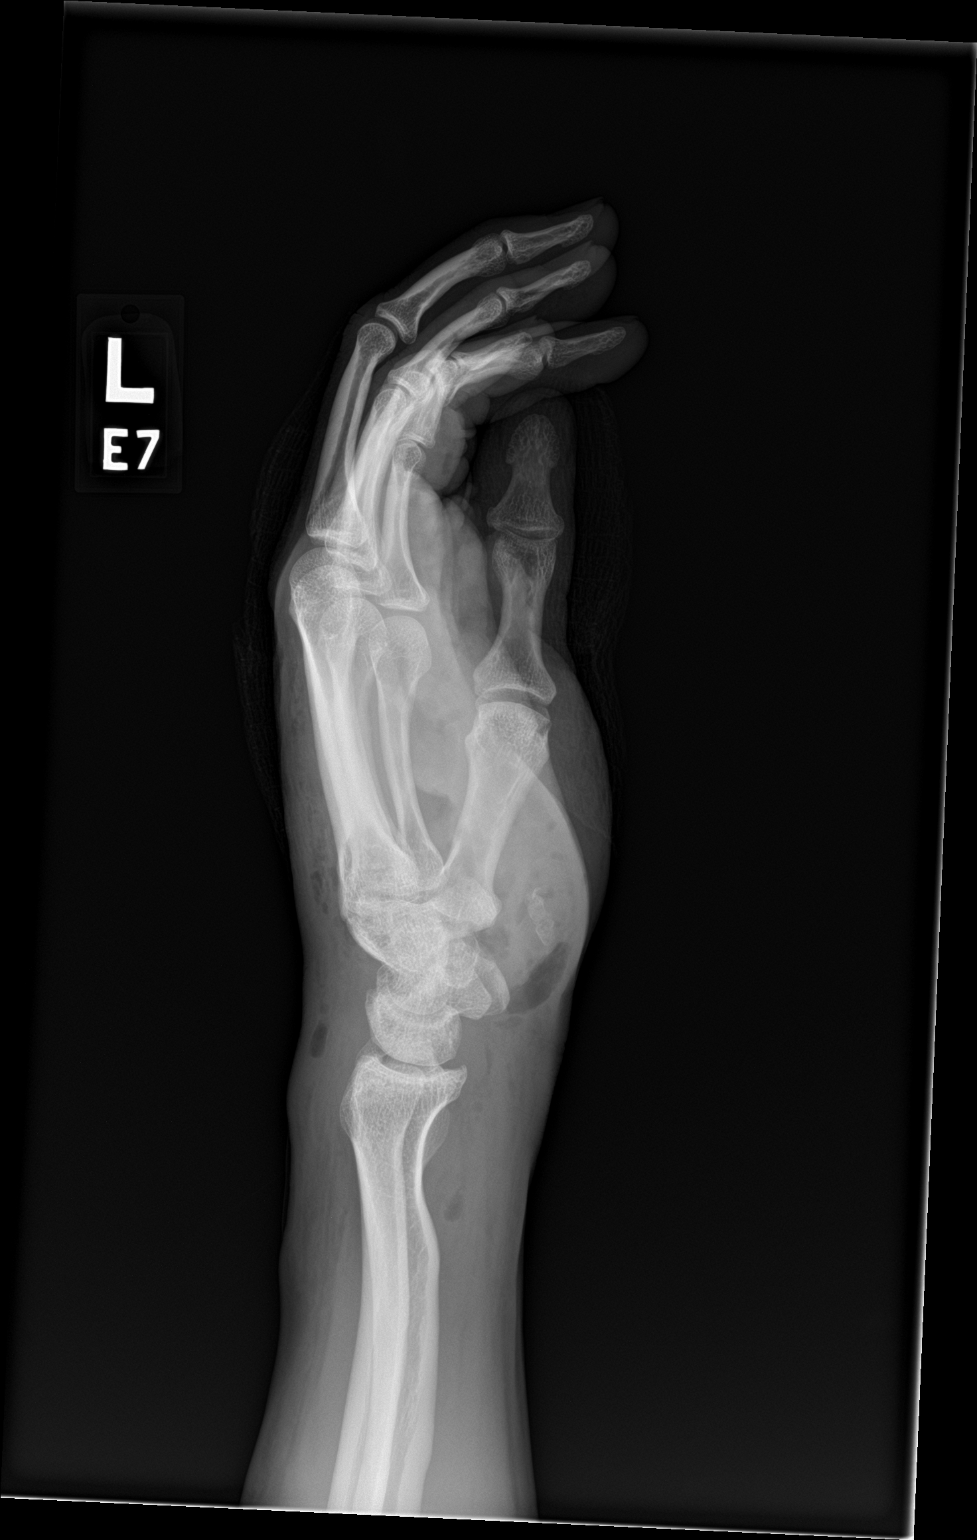

[3 of 3 positions shown; findings below may reference images not displayed]

FINDINGS: Widespread abnormal soft tissue gas about the hand and wrist. Distal
radius and ulna appear to remain intact. Carpal bone alignment
maintained. Fracture at the base of the 4th metacarpal appears to be
mildly comminuted and intra-articular. But is only visible on image
#1. Possible small avulsion type fracture also at the base of the
5th metacarpal on that image. MCP joints and phalanges appear
intact.
IMPRESSION: 1. Widespread posttraumatic soft tissue gas about the hand and
wrist.
2. Mildly comminuted and intra-articular fracture at the base of the
4th metacarpal. And suspected small avulsion type fracture at the
base of the 5th metacarpal.

## 2023-10-16 ENCOUNTER — Ambulatory Visit: Payer: 59 | Admitting: Physician Assistant

## 2023-10-30 ENCOUNTER — Other Ambulatory Visit: Payer: Self-pay | Admitting: Emergency Medicine

## 2023-10-30 DIAGNOSIS — J3089 Other allergic rhinitis: Secondary | ICD-10-CM

## 2023-10-30 NOTE — Telephone Encounter (Signed)
 Copied from CRM [id number]. Topic: Clinical - Medication Refill >> Oct 30, 2023 12:00 PM Theodis Sato wrote: Most Recent Primary Care Visit:  Provider: Georgina Quint  Department: [facility]  Visit Type: PHYSICAL  Date: 05/27/2023  Medication: fluticasone (FLONASE) 50 MCG/ACT nasal spray  Has the patient contacted their pharmacy? No, requesting this to be sent to a new pharmacy.   (Agent: If no, request that the patient contact the pharmacy for the refill. If patient does not wish to contact the pharmacy document the reason why and proceed with request.) (Agent: If yes, when and what did the pharmacy advise?)  Is this the correct pharmacy for this prescription? Yes If no, delete pharmacy and type the correct one.  This is the patient's preferred pharmacy:  [facility] [address], [place] - [address] [place] [address] Phone: [phone] Fax: [phone]    Has the prescription been filled recently? Yes  Is the patient out of the medication? Yes  Has the patient been seen for an appointment in the last year OR does the patient have an upcoming appointment? Yes  Can we respond through MyChart? No  Agent: Please be advised that Rx refills may take up to 3 business days. We ask that you follow-up with your pharmacy.

## 2023-10-31 MED ORDER — FLUTICASONE PROPIONATE 50 MCG/ACT NA SUSP
NASAL | 1 refills | Status: AC
Start: 2023-10-31 — End: ?

## 2023-11-27 ENCOUNTER — Encounter: Payer: Self-pay | Admitting: Physician Assistant

## 2023-11-27 ENCOUNTER — Ambulatory Visit: Payer: 59 | Admitting: Physician Assistant

## 2023-11-27 ENCOUNTER — Encounter: Payer: Self-pay | Admitting: Pediatrics

## 2023-11-27 VITALS — BP 100/76 | HR 68 | Ht 67.0 in | Wt 171.5 lb

## 2023-11-27 DIAGNOSIS — K625 Hemorrhage of anus and rectum: Secondary | ICD-10-CM

## 2023-11-27 DIAGNOSIS — K5904 Chronic idiopathic constipation: Secondary | ICD-10-CM | POA: Diagnosis not present

## 2023-11-27 MED ORDER — NA SULFATE-K SULFATE-MG SULF 17.5-3.13-1.6 GM/177ML PO SOLN: 1.0000 | Freq: Once | ORAL | 0 refills | Status: AC

## 2023-11-27 NOTE — Patient Instructions (Addendum)
 Miralax is an osmotic laxative.  It only brings more water into the stool.  This is safe to take daily.  Can take up to 17 gram of miralax twice a day.  Mix with juice or coffee.  Start 1/2 capful at night for 3-4 days and reassess your response in 3-4 days.  You can increase and decrease the dose based on your response.  Remember, it can take up to 3-4 days to take effect OR for the effects to wear off.   I often pair this with benefiber in the morning to help assure the stool is not too loose.   Anal Fissure, Adult  A fissure is a linear defect in the anal mucosa, symptoms include burning, itching, discomfort especially with a bowel movement with associated rectal bleeding.  Risk factors include low fiber diet, chronic constipation and straining. Anal fissures can take a very long time to heal so this will be a 2 to 66-month process.  Treatment for a fissure includes:  -decreasing time in the toilet should not be more than 5 minutes -adding fiber supplement such as Benefiber or Citrucel -increasing water. -There is a lubricating suppository over-the-counter called Calmol-4 you can get from [facility] or from your pharmacy (may have to order) that I want to do twice daily morning and evening for 8 weeks.  This is a 60 to 80% success rate.   Toileting tips to help with your constipation - Drink at least 64-80 ounces of water/liquid per day. - Establish a time to try to move your bowels every day.  For many people, this is after a cup of coffee or after a meal such as breakfast. - Sit all of the way back on the toilet keeping your back fairly straight and while sitting up, try to rest the tops of your forearms on your upper thighs.   - Raising your feet with a step stool/squatty potty can be helpful to improve the angle that allows your stool to pass through the rectum. - Relax the rectum feeling it bulge toward the toilet water.  If you feel your rectum raising toward your body, you are  contracting rather than relaxing. - Breathe in and slowly exhale. "Belly breath" by expanding your belly towards your belly button. Keep belly expanded as you gently direct pressure down and back to the anus.  A low pitched GRRR sound can assist with increasing intra-abdominal pressure.  (Can also trying to blow on a pinwheel and make it move, this helps with the same belly breathing) - Repeat 3-4 times. If unsuccessful, contract the pelvic floor to restore normal tone and get off the toilet.  Avoid excessive straining. - To reduce excessive wiping by teaching your anus to normally contract, place hands on outer aspect of knees and resist knee movement outward.  Hold 5-10 second then place hands just inside of knees and resist inward movement of knees.  Hold 5 seconds.  Repeat a few times each way.  Go to the ER if unable to pass gas, severe AB pain, unable to hold down food, any shortness of breath of chest pain.  You have been scheduled for a colonoscopy. Please follow written instructions given to you at your visit today.   If you use inhalers (even only as needed), please bring them with you on the day of your procedure.  DO NOT TAKE 7 DAYS PRIOR TO TEST- Trulicity (dulaglutide) Ozempic, Wegovy (semaglutide) Mounjaro (tirzepatide) Bydureon Bcise (exanatide extended release)  DO NOT TAKE  1 DAY PRIOR TO YOUR TEST Rybelsus (semaglutide) Adlyxin (lixisenatide) Victoza (liraglutide) Byetta (exanatide) ___________________________________________________________________________    Due to recent changes in healthcare laws, you may see the results of your imaging and laboratory studies on MyChart before your provider has had a chance to review them.  We understand that in some cases there may be results that are confusing or concerning to you. Not all laboratory results come back in the same time frame and the provider may be waiting for multiple results in order to interpret others.  Please  give us  48 hours in order for your provider to thoroughly review all the results before contacting the office for clarification of your results.    I appreciate the  opportunity to care for you  Thank You   [facility]

## 2023-11-27 NOTE — Progress Notes (Addendum)
 11/27/2023 Tyrone Hendricks [id number] May 05, 1966  Referring provider: Elvira Hammersmith, * Primary GI doctor: Dr. Yvone Herd  ASSESSMENT AND PLAN:   Blood in stool occasional constipation likely from anal fissure with redundant colon BRB small volume on TP, 1-2 x a month Associated with rectal itching, no pain Sitz baths, high-fiber diet, add on benefiber.  Long discussion about preventing constipation discussed in detail for prevention, add on 1/2 capful of miralax  Use calmol4 suppositories twice a day for 6 weeks Schedule for colonoscopy at [facility] We have discussed the risks of bleeding, infection, perforation, medication reactions, and remote risk of death associated with colonoscopy. All questions were answered and the patient acknowledges these risk and wishes to proceed.  Screening colon 09/2012 Dr. Adan Holms unremarkable colonoscopy other than difficult exam secondary to torturous and redundant colon, possible malrotation, external hemorrhoids and fissure  I have reviewed the clinic note as outlined by Santina Cull, PA and agree with the assessment, plan and medical decision making. Mr. Requena presents for evaluation of constipation and rectal bleeding that appears to be longstanding.  Has a previous history of external hemorrhoids and anal fissure.  Last colonoscopy performed in 2014 was endoscopically normal.  Documented to be a technically difficult exam due to redundant colon and tortuosity.  He is due for next colonoscopy and would recommend a 2-day bowel prep given constipation agree with conservative management for maintenance with OTC MiraLAX .  Stimulant laxatives can be added if needed.  If symptoms are refractory can consider prescription laxatives-Linzess, Amitiza, Trulance, Motegrity.    Eugenia Hess, MD    Patient Care Team: Elvira Hammersmith, MD as PCP - General (Internal Medicine)  HISTORY OF PRESENT ILLNESS: 58 y.o. male with a past medical history  listed below presents for evaluation of blood in stool.   Discussed the use of AI scribe software for clinical note transcription with the patient, who gave verbal consent to proceed.  History of Present Illness   He is a 58 year old male who presents with rectal bleeding and constipation.  He experiences rectal bleeding characterized by bright red blood on toilet paper after bowel movements, occurring approximately once or twice a month. This is associated with straining and hard stools, along with discomfort and itching. He has a history of external hemorrhoids and a healing fissure noted during a colonoscopy in 2014, which showed no polyps but a long and winding colon.  He manages his bowel movements with a drink containing apple cider vinegar and margarine, which helps prevent constipation. No diarrhea or loose stools, and no abdominal discomfort. He is not currently on any medications for heart or lung issues and is not taking blood thinners.  He experiences nausea and vomiting depending on his diet, particularly with fresh fruits and vegetables, which cause itching in his mouth. Cooked vegetables do not cause these symptoms. No upper GI symptoms such as heartburn, trouble swallowing, or reflux. There is no history of weight loss or significant pain.  No family history of colon cancer. He has allergies, which may contribute to his symptoms with raw fruits and vegetables.      He  reports that he quit smoking about 17 years ago. His smoking use included cigarettes. He has never used smokeless tobacco. He reports that he does not currently use alcohol. He reports that he does not use drugs.  RELEVANT GI HISTORY, IMAGING AND LABS: Results   DIAGNOSTIC Colonoscopy (2014): No polyps, external hemorrhoids, healing fissure, long and winding colon  CBC    Component Value Date/Time   WBC 3.9 (L) 05/27/2023 1033   RBC 5.73 05/27/2023 1033   HGB 14.2 05/27/2023 1033   HGB 13.7 04/18/2020  0851   HCT 44.9 05/27/2023 1033   HCT 44.4 04/18/2020 0851   PLT 191.0 05/27/2023 1033   PLT 203 04/18/2020 0851   MCV 78.4 05/27/2023 1033   MCV 77 (L) 04/18/2020 0851   MCH 23.8 (L) 04/18/2020 0851   MCH 24.6 (L) 07/04/2015 1536   MCHC 31.5 05/27/2023 1033   RDW 14.7 05/27/2023 1033   RDW 14.5 04/18/2020 0851   LYMPHSABS 1.2 05/27/2023 1033   LYMPHSABS 1.8 04/18/2020 0851   MONOABS 0.3 05/27/2023 1033   EOSABS 0.1 05/27/2023 1033   EOSABS 0.2 04/18/2020 0851   BASOSABS 0.0 05/27/2023 1033   BASOSABS 0.0 04/18/2020 0851   Recent Labs    05/27/23 1033  HGB 14.2    CMP     Component Value Date/Time   NA 140 05/27/2023 1033   NA 139 04/18/2020 0851   K 4.1 05/27/2023 1033   CL 105 05/27/2023 1033   CO2 29 05/27/2023 1033   GLUCOSE 80 05/27/2023 1033   BUN 12 05/27/2023 1033   BUN 12 04/18/2020 0851   CREATININE 0.91 05/27/2023 1033   CREATININE 0.81 07/04/2015 1536   CALCIUM 9.3 05/27/2023 1033   PROT 6.9 05/27/2023 1033   PROT 6.7 04/18/2020 0851   ALBUMIN 4.6 05/27/2023 1033   ALBUMIN 4.4 04/18/2020 0851   AST 16 05/27/2023 1033   ALT 12 05/27/2023 1033   ALKPHOS 56 05/27/2023 1033   BILITOT 0.6 05/27/2023 1033   BILITOT 0.4 04/18/2020 0851   GFRNONAA 79 04/18/2020 0851   GFRAA 92 04/18/2020 0851      Latest Ref Rng & Units 05/27/2023   10:33 AM 05/08/2022   10:37 AM 05/01/2021    8:28 AM  Hepatic Function  Total Protein 6.0 - 8.3 g/dL 6.9  7.3  6.9   Albumin 3.5 - 5.2 g/dL 4.6  4.6  4.5   AST 0 - 37 U/L 16  20  20    ALT 0 - 53 U/L 12  14  14    Alk Phosphatase 39 - 117 U/L 56  65  61   Total Bilirubin 0.2 - 1.2 mg/dL 0.6  0.7  0.8       Current Medications:     Current Outpatient Medications (Respiratory):    fluticasone  (FLONASE ) 50 MCG/ACT nasal spray, SHAKE LIQUID AND USE 2 SPRAYS IN EACH NOSTRIL DAILY AS DIRECTED    Current Outpatient Medications (Other):    Na Sulfate-K Sulfate-Mg Sulfate concentrate (SUPREP) 17.5-3.13-1.6 GM/177ML SOLN,  Take 1 kit (354 mLs total) by mouth once for 1 dose.  Medical History:  Past Medical History:  Diagnosis Date   Allergy    Anal fissure    Anxiety    Depression    Hemorrhoids    Unspecified constipation    Allergies:  Allergies  Allergen Reactions   Food Itching and Nausea And Vomiting    Fresh fruits and vegetables   Lactose Intolerance (Gi) Nausea And Vomiting   Pollen Extract     Other Reaction(s): Not available   Other Other (See Comments)    Flonase , sneezing     Surgical History:  He  has a past surgical history that includes Percutaneous pinning (Left, 05/30/2021). Family History:  His family history includes Diabetes in his brother; Hypertension in his mother.  REVIEW OF SYSTEMS  :  All other systems reviewed and negative except where noted in the History of Present Illness.  PHYSICAL EXAM: BP 100/76 (BP Location: Left Arm, Patient Position: Sitting, Cuff Size: Normal)   Pulse 68   Ht 5\' 7"  (1.702 m) Comment: height measured without shoes  Wt 171 lb 8 oz (77.8 kg)   BMI 26.86 kg/m  Physical Exam   GENERAL APPEARANCE: Well nourished, in no apparent distress. HEENT: No cervical lymphadenopathy, unremarkable thyroid, sclerae anicteric, conjunctiva pink. RESPIRATORY: Respiratory effort normal, breath sounds equal bilaterally without rales, rhonchi, or wheezing. CARDIO: Regular rate and rhythm with no murmurs, rubs, or gallops, peripheral pulses intact. ABDOMEN: Soft, non-distended, active bowel sounds in all four quadrants, no tenderness to palpation, no rebound, no mass appreciated. RECTAL: External hemorrhoid skin tag present, no rectal masses palpated, rectal tenderness on palpation. MUSCULOSKELETAL: Full range of motion, normal gait, without edema. SKIN: Dry, intact without rashes or lesions. No jaundice. NEURO: Alert, oriented, no focal deficits. PSYCH: Cooperative, normal mood and affect.      Edmonia Gottron, PA-C 10:01 AM

## 2023-12-01 ENCOUNTER — Telehealth: Payer: Self-pay | Admitting: *Deleted

## 2023-12-01 NOTE — Telephone Encounter (Addendum)
 We need to add Miralax  2 days before with half a day full liquids    Two days before procedure  12/06/2023   Purchase a bottle of Over the counter Miralax   You will then drink 6-8 capfuls of Miralax  mixed in an adequate amount of water/juice/gatorade (you may choose which of these liquids to drink) over the next 2-3 hours. You should expect results within 1 to 6 hours after completing the bowel purge.   Called patient he has to call back because he ws driving   Patient returned my call, went over two day prep that was added for him due to chronic constipation.He seemed to understand and if he has any problems to contact the office

## 2023-12-01 NOTE — Telephone Encounter (Signed)
===  View-only below this line=== ----- Message ----- From: Edmonia Gottron, PA-C Sent: 12/01/2023  12:16 PM EDT To: Chrisandra Counts, CMA  Yes, thank you! Sorry Mylinda Asa ----- Message ----- From: Chrisandra Counts, CMA Sent: 12/01/2023  12:09 PM EDT To: Edmonia Gottron, PA-C  Nope I did not give him a 2 day prep. I gave him Suprep I can call him and add Miralax  two days before his procedure Is this ok ----- Message ----- From: Edmonia Gottron, PA-C Sent: 12/01/2023   9:20 AM EDT To: Chrisandra Counts, CMA  Can we please make sure this is 2 day prep? Thanks  Mylinda Asa ----- Message ----- From: Truddie Furrow, MD Sent: 11/28/2023   1:29 PM EDT To: Edmonia Gottron, PA-C  Wanted to check to see that he was given a 2-day bowel cleanout for his colonoscopy given history of constipation - Haskell Linker

## 2023-12-07 NOTE — Progress Notes (Unsigned)
 [facility] History and Physical   Primary Care Physician:  Elvira Hammersmith, MD   Reason for Procedure:  Colorectal cancer screening; incidental notation of intermittent rectal bleeding suspected to be due to anal fissure  Plan:    Screening colonoscopy     HPI: Tyrone Hendricks is a 58 y.o. male undergoing screening colonoscopy for colorectal cancer screening.  Last colonoscopy was performed in 2014 and was endoscopically normal.  Documented to be a technically difficult exam due to redundant colon and tortuosity.  Manual pressure was required to reach the cecum.  No family history of colorectal cancer or polyps.  Patient has this incidentally noted intermittent rectal bleeding that may be attributable to an anal fissure.  Endorses constipation and was advised to take MiraLAX .   Past Medical History:  Diagnosis Date   Allergy    Anal fissure    Anxiety    Depression    Hemorrhoids    Unspecified constipation     Past Surgical History:  Procedure Laterality Date   PERCUTANEOUS PINNING Left 05/30/2021   Procedure: Left hand Irrigation and Debridment, closed reduction percutaneous pinning;  Surgeon: Arvil Birks, MD;  Location: [facility] OR;  Service: Orthopedics;  Laterality: Left;    Prior to Admission medications   Medication Sig Start Date End Date Taking? Authorizing Provider  fluticasone  (FLONASE ) 50 MCG/ACT nasal spray SHAKE LIQUID AND USE 2 SPRAYS IN EACH NOSTRIL DAILY AS DIRECTED 10/31/23   Sagardia, Miguel Jose, MD  fluticasone  (FLONASE ) 50 MCG/ACT nasal spray USE 2 SPRAYS IN EACH NOSTRIL DAILY AS DIRECTED 12/13/19   Elyce Hams, Marguerita Shih, MD    Current Outpatient Medications  Medication Sig Dispense Refill   fluticasone  (FLONASE ) 50 MCG/ACT nasal spray SHAKE LIQUID AND USE 2 SPRAYS IN EACH NOSTRIL DAILY AS DIRECTED 48 g 1   No current facility-administered medications for this visit.    Allergies as of 12/08/2023 - Review Complete 12/08/2023  Allergen  Reaction Noted   Food Itching and Nausea And Vomiting 11/27/2023   Lactose intolerance (gi) Nausea And Vomiting 05/30/2021   Pollen extract Itching and Other (See Comments) 11/27/2023    Family History  Problem Relation Age of Onset   Hypertension Mother    Diabetes Brother    Colon cancer Neg Hx    Colon polyps Neg Hx     Social History   Socioeconomic History   Marital status: Married    Spouse name: Not on file   Number of children: 1   Years of education: Not on file   Highest education level: Bachelor's degree (e.g., BA, AB, BS)  Occupational History   Occupation: [employer]  Tobacco Use   Smoking status: Former    Current packs/day: 0.00    Types: Cigarettes    Quit date: 11/20/2006    Years since quitting: 17.0   Smokeless tobacco: Never  Vaping Use   Vaping status: Never Used  Substance and Sexual Activity   Alcohol use: Not Currently   Drug use: No   Sexual activity: Yes  Other Topics Concern   Not on file  Social History Narrative   Pt from [place].  Came to [place]  in 2001.       Social Drivers of Corporate investment banker Strain: Low Risk  (05/27/2023)   Overall Financial Resource Strain (CARDIA)    Difficulty of Paying Living Expenses: Not very hard  Food Insecurity: No Food Insecurity (05/27/2023)   Hunger Vital Sign    Worried About Running  Out of Food in the Last Year: Never true    Ran Out of Food in the Last Year: Never true  Transportation Needs: No Transportation Needs (05/27/2023)   PRAPARE - Administrator, Civil Service (Medical): No    Lack of Transportation (Non-Medical): No  Physical Activity: Unknown (05/27/2023)   Exercise Vital Sign    Days of Exercise per Week: 0 days    Minutes of Exercise per Session: Not on file  Stress: Stress Concern Present (05/27/2023)   [employer] of Occupational Health - Occupational Stress Questionnaire    Feeling of Stress : To some extent  Social Connections: Moderately Isolated  (05/27/2023)   Social Connection and Isolation Panel [NHANES]    Frequency of Communication with Friends and Family: Twice a week    Frequency of Social Gatherings with Friends and Family: More than three times a week    Attends Religious Services: Never    Database administrator or Organizations: No    Attends Engineer, structural: Not on file    Marital Status: Married  Catering manager Violence: Not on file    Review of Systems:  All other review of systems negative except as mentioned in the HPI.  Physical Exam: Vital signs BP 106/69   Pulse 60   Temp 97.8 F (36.6 C) (Temporal)   Resp 18   Ht 5\' 7"  (1.702 m)   Wt 170 lb (77.1 kg)   SpO2 100%   BMI 26.63 kg/m   General:   Alert,  Well-developed, well-nourished, pleasant and cooperative in NAD Airway:  Mallampati 2 Lungs:  Clear throughout to auscultation.   Heart:  Regular rate and rhythm; no murmurs, clicks, rubs,  or gallops. Abdomen:  Soft, nontender and nondistended. Normal bowel sounds.   Neuro/Psych:  Normal mood and affect. A and O x 3  Eugenia Hess, MD [facility]

## 2023-12-08 ENCOUNTER — Ambulatory Visit (AMBULATORY_SURGERY_CENTER): Admitting: Pediatrics

## 2023-12-08 ENCOUNTER — Encounter: Payer: Self-pay | Admitting: Pediatrics

## 2023-12-08 VITALS — BP 102/63 | HR 60 | Temp 97.8°F | Resp 21 | Ht 67.0 in | Wt 170.0 lb

## 2023-12-08 DIAGNOSIS — D125 Benign neoplasm of sigmoid colon: Secondary | ICD-10-CM | POA: Diagnosis not present

## 2023-12-08 DIAGNOSIS — K648 Other hemorrhoids: Secondary | ICD-10-CM | POA: Diagnosis not present

## 2023-12-08 DIAGNOSIS — D123 Benign neoplasm of transverse colon: Secondary | ICD-10-CM

## 2023-12-08 DIAGNOSIS — D124 Benign neoplasm of descending colon: Secondary | ICD-10-CM | POA: Diagnosis not present

## 2023-12-08 DIAGNOSIS — D122 Benign neoplasm of ascending colon: Secondary | ICD-10-CM

## 2023-12-08 DIAGNOSIS — D120 Benign neoplasm of cecum: Secondary | ICD-10-CM

## 2023-12-08 DIAGNOSIS — Z1211 Encounter for screening for malignant neoplasm of colon: Secondary | ICD-10-CM

## 2023-12-08 NOTE — Op Note (Signed)
 [facility] Patient Name: Tyrone Hendricks Procedure Date: 12/08/2023 9:04 AM MRN: [id number] Endoscopist: Eugenia Hess , MD, [id number] Age: 58 Referring MD:  Date of Birth: Dec 24, 1965 Gender: Male Account #: [id number] Procedure:                Colonoscopy Indications:              Screening for colorectal malignant neoplasm, Last                            colonoscopy: 2014 Medicines:                Monitored Anesthesia Care Procedure:                Pre-Anesthesia Assessment:                           - Prior to the procedure, a History and Physical                            was performed, and patient medications and                            allergies were reviewed. The patient's tolerance of                            previous anesthesia was also reviewed. The risks                            and benefits of the procedure and the sedation                            options and risks were discussed with the patient.                            All questions were answered, and informed consent                            was obtained. Prior Anticoagulants: The patient has                            taken no anticoagulant or antiplatelet agents. ASA                            Grade Assessment: II - A patient with mild systemic                            disease. After reviewing the risks and benefits,                            the patient was deemed in satisfactory condition to                            undergo the procedure.  After obtaining informed consent, the colonoscope                            was passed under direct vision. Throughout the                            procedure, the patient's blood pressure, pulse, and                            oxygen saturations were monitored continuously. The                            CF [id number] #[id number] was introduced through the anus                            and advanced to the cecum, identified by                             appendiceal orifice and ileocecal valve. The                            colonoscopy was performed without difficulty. An                            abdominal binder was used to facilitate the                            procedure given report of technically difficult                            colonoscopy in 2014. The patient tolerated the                            procedure well. The quality of the bowel                            preparation was good. The ileocecal valve,                            appendiceal orifice, and rectum were photographed. Scope In: 9:40:30 AM Scope Out: 10:04:41 AM Scope Withdrawal Time: 0 hours 17 minutes 56 seconds  Total Procedure Duration: 0 hours 24 minutes 11 seconds  Findings:                 The perianal and digital rectal examinations were                            normal. Pertinent negatives include normal                            sphincter tone and no palpable rectal lesions.                           Nine sessile polyps were found in the sigmoid colon                            (  1), descending colon (1), transverse colon (2),                            ascending colon (3) and cecum (2). The polyps were                            4 to 7 mm in size. These polyps were removed with a                            cold snare. Resection and retrieval were complete.                           A 3 mm polyp was found in the descending colon. The                            polyp was sessile. The polyp was removed with a                            cold biopsy forceps. Resection and retrieval were                            complete.                           Internal hemorrhoids were found during retroflexion. Complications:            No immediate complications. Estimated blood loss:                            Minimal. Estimated Blood Loss:     Estimated blood loss was minimal. Impression:               - Nine 4 to 7 mm polyps in the  sigmoid colon, in                            the descending colon, in the transverse colon, in                            the ascending colon and in the cecum, removed with                            a cold snare. Resected and retrieved.                           - One 3 mm polyp in the descending colon, removed                            with a cold biopsy forceps. Resected and retrieved.                           - Internal hemorrhoids. Recommendation:           - Discharge patient to home (ambulatory).                           -  Await pathology results.                           - Repeat colonoscopy for surveillance based on                            pathology results.                           - The findings and recommendations were discussed                            with the patient's family.                           - Return to referring physician.                           - Patient has a contact number available for                            emergencies. The signs and symptoms of potential                            delayed complications were discussed with the                            patient. Return to normal activities tomorrow.                            Written discharge instructions were provided to the                            patient. Eugenia Hess, MD 12/08/2023 10:12:12 AM This report has been signed electronically.

## 2023-12-08 NOTE — Progress Notes (Signed)
 Called to room to assist during endoscopic procedure.  Patient ID and intended procedure confirmed with present staff. Received instructions for my participation in the procedure from the performing physician.

## 2023-12-08 NOTE — Progress Notes (Signed)
 Pt's states no medical or surgical changes since previsit or office visit.

## 2023-12-08 NOTE — Progress Notes (Signed)
 To pacu, VSS. Report to [email or web address]

## 2023-12-08 NOTE — Patient Instructions (Addendum)
-  Handout on polyps and hemorrhoids provided -await pathology results -repeat colonoscopy for surveillance recommended. Date to be determined when pathology result become available    YOU HAD AN ENDOSCOPIC PROCEDURE TODAY AT THE [facility]:   Refer to the procedure report that was given to you for any specific questions about what was found during the examination.  If the procedure report does not answer your questions, please call your gastroenterologist to clarify.  If you requested that your care partner not be given the details of your procedure findings, then the procedure report has been included in a sealed envelope for you to review at your convenience later.  YOU SHOULD EXPECT: Some feelings of bloating in the abdomen. Passage of more gas than usual.  Walking can help get rid of the air that was put into your GI tract during the procedure and reduce the bloating. If you had a lower endoscopy (such as a colonoscopy or flexible sigmoidoscopy) you may notice spotting of blood in your stool or on the toilet paper. If you underwent a bowel prep for your procedure, you may not have a normal bowel movement for a few days.  Please Note:  You might notice some irritation and congestion in your nose or some drainage.  This is from the oxygen used during your procedure.  There is no need for concern and it should clear up in a day or so.  SYMPTOMS TO REPORT IMMEDIATELY:  Following lower endoscopy (colonoscopy or flexible sigmoidoscopy):  Excessive amounts of blood in the stool  Significant tenderness or worsening of abdominal pains  Swelling of the abdomen that is new, acute  Fever of 100F or higher  For urgent or emergent issues, a gastroenterologist can be reached at any hour by calling [phone]. Do not use MyChart messaging for urgent concerns.    DIET:  We do recommend a small meal at first, but then you may proceed to your regular diet.  Drink plenty of fluids but you  should avoid alcoholic beverages for 24 hours.  ACTIVITY:  You should plan to take it easy for the rest of today and you should NOT DRIVE or use heavy machinery until tomorrow (because of the sedation medicines used during the test).    FOLLOW UP: Our staff will call the number listed on your records the next business day following your procedure.  We will call around 7:15- 8:00 am to check on you and address any questions or concerns that you may have regarding the information given to you following your procedure. If we do not reach you, we will leave a message.     If any biopsies were taken you will be contacted by phone or by letter within the next 1-3 weeks.  Please call us at [phone] if you have not heard about the biopsies in 3 weeks.    SIGNATURES/CONFIDENTIALITY: You and/or your care partner have signed paperwork which will be entered into your electronic medical record.  These signatures attest to the fact that that the information above on your After Visit Summary has been reviewed and is understood.  Full responsibility of the confidentiality of this discharge information lies with you and/or your care-partner.

## 2023-12-09 ENCOUNTER — Encounter: Payer: Self-pay | Admitting: Emergency Medicine

## 2023-12-09 ENCOUNTER — Ambulatory Visit: Payer: Self-pay | Admitting: Emergency Medicine

## 2023-12-09 ENCOUNTER — Telehealth: Payer: Self-pay | Admitting: *Deleted

## 2023-12-09 VITALS — BP 104/80 | HR 57 | Temp 98.8°F | Ht 67.0 in | Wt 170.2 lb

## 2023-12-09 DIAGNOSIS — Z889 Allergy status to unspecified drugs, medicaments and biological substances status: Secondary | ICD-10-CM

## 2023-12-09 NOTE — Patient Instructions (Signed)
 Health Maintenance, Male  Adopting a healthy lifestyle and getting preventive care are important in promoting health and wellness. Ask your health care provider about:  The right schedule for you to have regular tests and exams.  Things you can do on your own to prevent diseases and keep yourself healthy.  What should I know about diet, weight, and exercise?  Eat a healthy diet    Eat a diet that includes plenty of vegetables, fruits, low-fat dairy products, and lean protein.  Do not eat a lot of foods that are high in solid fats, added sugars, or sodium.  Maintain a healthy weight  Body mass index (BMI) is a measurement that can be used to identify possible weight problems. It estimates body fat based on height and weight. Your health care provider can help determine your BMI and help you achieve or maintain a healthy weight.  Get regular exercise  Get regular exercise. This is one of the most important things you can do for your health. Most adults should:  Exercise for at least 150 minutes each week. The exercise should increase your heart rate and make you sweat (moderate-intensity exercise).  Do strengthening exercises at least twice a week. This is in addition to the moderate-intensity exercise.  Spend less time sitting. Even light physical activity can be beneficial.  Watch cholesterol and blood lipids  Have your blood tested for lipids and cholesterol at 56 years of age, then have this test every 5 years.  You may need to have your cholesterol levels checked more often if:  Your lipid or cholesterol levels are high.  You are older than 56 years of age.  You are at high risk for heart disease.  What should I know about cancer screening?  Many types of cancers can be detected early and may often be prevented. Depending on your health history and family history, you may need to have cancer screening at various ages. This may include screening for:  Colorectal cancer.  Prostate cancer.  Skin cancer.  Lung  cancer.  What should I know about heart disease, diabetes, and high blood pressure?  Blood pressure and heart disease  High blood pressure causes heart disease and increases the risk of stroke. This is more likely to develop in people who have high blood pressure readings or are overweight.  Talk with your health care provider about your target blood pressure readings.  Have your blood pressure checked:  Every 3-5 years if you are 56-95 years of age.  Every year if you are 56 years old or older.  If you are between the ages of 29 and 29 and are a current or former smoker, ask your health care provider if you should have a one-time screening for abdominal aortic aneurysm (AAA).  Diabetes  Have regular diabetes screenings. This checks your fasting blood sugar level. Have the screening done:  Once every three years after age 56 if you are at a normal weight and have a low risk for diabetes.  More often and at a younger age if you are overweight or have a high risk for diabetes.  What should I know about preventing infection?  Hepatitis B  If you have a higher risk for hepatitis B, you should be screened for this virus. Talk with your health care provider to find out if you are at risk for hepatitis B infection.  Hepatitis C  Blood testing is recommended for:  Everyone born from [birth year] through 1963.  Anyone  with known risk factors for hepatitis C.  Sexually transmitted infections (STIs)  You should be screened each year for STIs, including gonorrhea and chlamydia, if:  You are sexually active and are younger than 56 years of age.  You are older than 56 years of age and your health care provider tells you that you are at risk for this type of infection.  Your sexual activity has changed since you were last screened, and you are at increased risk for chlamydia or gonorrhea. Ask your health care provider if you are at risk.  Ask your health care provider about whether you are at high risk for HIV. Your health care provider  may recommend a prescription medicine to help prevent HIV infection. If you choose to take medicine to prevent HIV, you should first get tested for HIV. You should then be tested every 3 months for as long as you are taking the medicine.  Follow these instructions at home:  Alcohol use  Do not drink alcohol if your health care provider tells you not to drink.  If you drink alcohol:  Limit how much you have to 0-2 drinks a day.  Know how much alcohol is in your drink. In the [place] one drink equals one 12 oz bottle of beer (355 mL), one 5 oz glass of wine (148 mL), or one 1 oz glass of hard liquor (44 mL).  Lifestyle  Do not use any products that contain nicotine or tobacco. These products include cigarettes, chewing tobacco, and vaping devices, such as e-cigarettes. If you need help quitting, ask your health care provider.  Do not use street drugs.  Do not share needles.  Ask your health care provider for help if you need support or information about quitting drugs.  General instructions  Schedule regular health, dental, and eye exams.  Stay current with your vaccines.  Tell your health care provider if:  You often feel depressed.  You have ever been abused or do not feel safe at home.  Summary  Adopting a healthy lifestyle and getting preventive care are important in promoting health and wellness.  Follow your health care provider's instructions about healthy diet, exercising, and getting tested or screened for diseases.  Follow your health care provider's instructions on monitoring your cholesterol and blood pressure.  This information is not intended to replace advice given to you by your health care provider. Make sure you discuss any questions you have with your health care provider.  Document Revised: 12/18/2020 Document Reviewed: 12/18/2020  Elsevier Patient Education  2024 [employer].

## 2023-12-09 NOTE — Telephone Encounter (Signed)
  Follow up Call-     12/08/2023    9:12 AM  Call back number  Post procedure Call Back phone  # [phone]  Permission to leave phone message Yes     Patient questions:  Do you have a fever, pain , or abdominal swelling? No. Pain Score  0 *  Have you tolerated food without any problems? Yes.    Have you been able to return to your normal activities? Yes.    Do you have any questions about your discharge instructions: Diet   No. Medications  No. Follow up visit  No.  Do you have questions or concerns about your Care? No.  Actions: * If pain score is 4 or above: No action needed, pain <4.

## 2023-12-09 NOTE — Assessment & Plan Note (Signed)
 Claims to be allergic to multiple foods but not sure which ones Nonspecific symptoms after eating certain foods History of multiple allergies over the past several years Season makes it worse Recommend evaluation by allergist Referral placed today.

## 2023-12-09 NOTE — Progress Notes (Signed)
 Tyrone Hendricks 58 y.o.   Chief Complaint  Patient presents with   Allergic Reaction    Would like a referral to an allergist, feels he is allergic to more foods     HISTORY OF PRESENT ILLNESS: This is a 58 y.o. male complaining of multiple allergies to foods Gets nauseous and throws up with tingling in the mouth at times Requesting referral to allergist No other complaints or medical concerns today.  Allergic Reaction Pertinent negatives include no chest pain, coughing or rash.     Prior to Admission medications   Medication Sig Start Date End Date Taking? Authorizing Provider  fluticasone  (FLONASE ) 50 MCG/ACT nasal spray SHAKE LIQUID AND USE 2 SPRAYS IN EACH NOSTRIL DAILY AS DIRECTED 10/31/23  Yes Ugonna Keirsey, Isidro Margo, MD  fluticasone  (FLONASE ) 50 MCG/ACT nasal spray USE 2 SPRAYS IN EACH NOSTRIL DAILY AS DIRECTED 12/13/19   Elyce Hams, Marguerita Shih, MD    Allergies  Allergen Reactions   Food Itching and Nausea And Vomiting    Fresh fruits and vegetables   Lactose Intolerance (Gi) Nausea And Vomiting   Pollen Extract Itching and Other (See Comments)    sneezing    Patient Active Problem List   Diagnosis Date Noted   Social anxiety disorder 02/20/2021   Non-seasonal allergic rhinitis 02/20/2021   Constipation, chronic 07/13/2012   Depression 09/21/2011    Past Medical History:  Diagnosis Date   Allergy    Anal fissure    Anxiety    Depression    Hemorrhoids    Unspecified constipation     Past Surgical History:  Procedure Laterality Date   PERCUTANEOUS PINNING Left 05/30/2021   Procedure: Left hand Irrigation and Debridment, closed reduction percutaneous pinning;  Surgeon: Arvil Birks, MD;  Location: [facility] OR;  Service: Orthopedics;  Laterality: Left;    Social History   Socioeconomic History   Marital status: Married    Spouse name: Not on file   Number of children: 1   Years of education: Not on file   Highest education level: Bachelor's degree (e.g., BA,  AB, BS)  Occupational History   Occupation: [employer]  Tobacco Use   Smoking status: Former    Current packs/day: 0.00    Types: Cigarettes    Quit date: 11/20/2006    Years since quitting: 17.0   Smokeless tobacco: Never  Vaping Use   Vaping status: Never Used  Substance and Sexual Activity   Alcohol use: Not Currently   Drug use: No   Sexual activity: Yes  Other Topics Concern   Not on file  Social History Narrative   Pt from [place].  Came to [place]  in 2001.       Social Drivers of Corporate investment banker Strain: Low Risk  (05/27/2023)   Overall Financial Resource Strain (CARDIA)    Difficulty of Paying Living Expenses: Not very hard  Food Insecurity: No Food Insecurity (05/27/2023)   Hunger Vital Sign    Worried About Running Out of Food in the Last Year: Never true    Ran Out of Food in the Last Year: Never true  Transportation Needs: No Transportation Needs (05/27/2023)   PRAPARE - Administrator, Civil Service (Medical): No    Lack of Transportation (Non-Medical): No  Physical Activity: Unknown (05/27/2023)   Exercise Vital Sign    Days of Exercise per Week: 0 days    Minutes of Exercise per Session: Not on file  Stress: Stress Concern Present (05/27/2023)  [employer] of Occupational Health - Occupational Stress Questionnaire    Feeling of Stress : To some extent  Social Connections: Moderately Isolated (05/27/2023)   Social Connection and Isolation Panel [NHANES]    Frequency of Communication with Friends and Family: Twice a week    Frequency of Social Gatherings with Friends and Family: More than three times a week    Attends Religious Services: Never    Database administrator or Organizations: No    Attends Engineer, structural: Not on file    Marital Status: Married  Catering manager Violence: Not on file    Family History  Problem Relation Age of Onset   Hypertension Mother    Diabetes Brother    Colon cancer Neg Hx     Colon polyps Neg Hx      Review of Systems  Constitutional: Negative.  Negative for chills and fever.  HENT: Negative.  Negative for congestion and sore throat.   Respiratory: Negative.  Negative for cough and shortness of breath.   Cardiovascular: Negative.  Negative for chest pain and palpitations.  Gastrointestinal:  Positive for nausea.  Genitourinary: Negative.  Negative for dysuria and hematuria.  Skin: Negative.  Negative for rash.  Neurological: Negative.  Negative for dizziness and headaches.  All other systems reviewed and are negative.   Vitals:   12/09/23 1016  BP: 104/80  Pulse: (!) 57  Temp: 98.8 F (37.1 C)  SpO2: 98%    Physical Exam Vitals reviewed.  Constitutional:      Appearance: Normal appearance.  HENT:     Head: Normocephalic.     Mouth/Throat:     Mouth: Mucous membranes are moist.     Pharynx: Oropharynx is clear.  Eyes:     Extraocular Movements: Extraocular movements intact.     Conjunctiva/sclera: Conjunctivae normal.     Pupils: Pupils are equal, round, and reactive to light.  Cardiovascular:     Rate and Rhythm: Normal rate and regular rhythm.     Pulses: Normal pulses.     Heart sounds: Normal heart sounds.  Pulmonary:     Effort: Pulmonary effort is normal.     Breath sounds: Normal breath sounds.  Abdominal:     Palpations: Abdomen is soft.     Tenderness: There is no abdominal tenderness.  Musculoskeletal:     Cervical back: No tenderness.  Lymphadenopathy:     Cervical: No cervical adenopathy.  Skin:    General: Skin is warm and dry.     Capillary Refill: Capillary refill takes less than 2 seconds.  Neurological:     General: No focal deficit present.     Mental Status: He is alert and oriented to person, place, and time.  Psychiatric:        Mood and Affect: Mood normal.        Behavior: Behavior normal.      ASSESSMENT & PLAN: A total of 32 minutes was spent with the patient and counseling/coordination of care  regarding preparing for this visit, review of most recent office visit notes, review of most recent blood work results, review of past medical history and medical problems, diagnosis of multiple allergies and need for allergist evaluation, review of all medications, symptom management, prognosis, documentation and need for follow-up.  Problem List Items Addressed This Visit       Other   Multiple allergies - Primary   Claims to be allergic to multiple foods but not sure which ones Nonspecific  symptoms after eating certain foods History of multiple allergies over the past several years Season makes it worse Recommend evaluation by allergist Referral placed today.      Relevant Orders   Ambulatory referral to Allergy   Patient Instructions  Health Maintenance, Male Adopting a healthy lifestyle and getting preventive care are important in promoting health and wellness. Ask your health care provider about: The right schedule for you to have regular tests and exams. Things you can do on your own to prevent diseases and keep yourself healthy. What should I know about diet, weight, and exercise? Eat a healthy diet  Eat a diet that includes plenty of vegetables, fruits, low-fat dairy products, and lean protein. Do not eat a lot of foods that are high in solid fats, added sugars, or sodium. Maintain a healthy weight Body mass index (BMI) is a measurement that can be used to identify possible weight problems. It estimates body fat based on height and weight. Your health care provider can help determine your BMI and help you achieve or maintain a healthy weight. Get regular exercise Get regular exercise. This is one of the most important things you can do for your health. Most adults should: Exercise for at least 150 minutes each week. The exercise should increase your heart rate and make you sweat (moderate-intensity exercise). Do strengthening exercises at least twice a week. This is in  addition to the moderate-intensity exercise. Spend less time sitting. Even light physical activity can be beneficial. Watch cholesterol and blood lipids Have your blood tested for lipids and cholesterol at 56 years of age, then have this test every 5 years. You may need to have your cholesterol levels checked more often if: Your lipid or cholesterol levels are high. You are older than 56 years of age. You are at high risk for heart disease. What should I know about cancer screening? Many types of cancers can be detected early and may often be prevented. Depending on your health history and family history, you may need to have cancer screening at various ages. This may include screening for: Colorectal cancer. Prostate cancer. Skin cancer. Lung cancer. What should I know about heart disease, diabetes, and high blood pressure? Blood pressure and heart disease High blood pressure causes heart disease and increases the risk of stroke. This is more likely to develop in people who have high blood pressure readings or are overweight. Talk with your health care provider about your target blood pressure readings. Have your blood pressure checked: Every 3-5 years if you are 42-32 years of age. Every year if you are 82 years old or older. If you are between the ages of 50 and 46 and are a current or former smoker, ask your health care provider if you should have a one-time screening for abdominal aortic aneurysm (AAA). Diabetes Have regular diabetes screenings. This checks your fasting blood sugar level. Have the screening done: Once every three years after age 67 if you are at a normal weight and have a low risk for diabetes. More often and at a younger age if you are overweight or have a high risk for diabetes. What should I know about preventing infection? Hepatitis B If you have a higher risk for hepatitis B, you should be screened for this virus. Talk with your health care provider to find out  if you are at risk for hepatitis B infection. Hepatitis C Blood testing is recommended for: Everyone born from [birth year] through 1963. Anyone with known  risk factors for hepatitis C. Sexually transmitted infections (STIs) You should be screened each year for STIs, including gonorrhea and chlamydia, if: You are sexually active and are younger than 56 years of age. You are older than 56 years of age and your health care provider tells you that you are at risk for this type of infection. Your sexual activity has changed since you were last screened, and you are at increased risk for chlamydia or gonorrhea. Ask your health care provider if you are at risk. Ask your health care provider about whether you are at high risk for HIV. Your health care provider may recommend a prescription medicine to help prevent HIV infection. If you choose to take medicine to prevent HIV, you should first get tested for HIV. You should then be tested every 3 months for as long as you are taking the medicine. Follow these instructions at home: Alcohol use Do not drink alcohol if your health care provider tells you not to drink. If you drink alcohol: Limit how much you have to 0-2 drinks a day. Know how much alcohol is in your drink. In the [place] one drink equals one 12 oz bottle of beer (355 mL), one 5 oz glass of wine (148 mL), or one 1 oz glass of hard liquor (44 mL). Lifestyle Do not use any products that contain nicotine or tobacco. These products include cigarettes, chewing tobacco, and vaping devices, such as e-cigarettes. If you need help quitting, ask your health care provider. Do not use street drugs. Do not share needles. Ask your health care provider for help if you need support or information about quitting drugs. General instructions Schedule regular health, dental, and eye exams. Stay current with your vaccines. Tell your health care provider if: You often feel depressed. You have ever been abused or do  not feel safe at home. Summary Adopting a healthy lifestyle and getting preventive care are important in promoting health and wellness. Follow your health care provider's instructions about healthy diet, exercising, and getting tested or screened for diseases. Follow your health care provider's instructions on monitoring your cholesterol and blood pressure. This information is not intended to replace advice given to you by your health care provider. Make sure you discuss any questions you have with your health care provider. Document Revised: 12/18/2020 Document Reviewed: 12/18/2020 Elsevier Patient Education  2024 [employer]vier Inc.     Maryagnes Small, MD [facility]

## 2023-12-10 ENCOUNTER — Encounter: Payer: Self-pay | Admitting: Pediatrics

## 2023-12-10 LAB — SURGICAL PATHOLOGY

## 2024-02-22 NOTE — Progress Notes (Unsigned)
 New Patient Note  RE: Tyrone Hendricks MRN: [id number] DOB: Dec 30, 1965 Date of Office Visit: 02/23/2024  Consult requested by: [name], * Primary care provider: [name], MD  Chief Complaint: No chief complaint on file.  History of Present Illness: I had the pleasure of seeing Tyrone Hendricks for initial evaluation at the [facility] on 02/23/2024. He is a 58 y.o. male, who is referred here by [name], MD for the evaluation of ***.  Discussed the use of AI scribe software for clinical note transcription with the patient, who gave verbal consent to proceed.  History of Present Illness             ***  Assessment and Plan: Tyrone Hendricks is a 58 y.o. male with: ***  Assessment and Plan               No follow-ups on file.  No orders of the defined types were placed in this encounter.  Lab Orders  No laboratory test(s) ordered today    Other allergy screening: Asthma: {Blank single:19197::yes,no} Rhino conjunctivitis: {Blank single:19197::yes,no} Food allergy: {Blank single:19197::yes,no} Medication allergy: {Blank single:19197::yes,no} Hymenoptera allergy: {Blank single:19197::yes,no} Urticaria: {Blank single:19197::yes,no} Eczema:{Blank single:19197::yes,no} History of recurrent infections suggestive of immunodeficency: {Blank single:19197::yes,no}  Diagnostics: Spirometry:  Tracings reviewed. His effort: {Blank single:19197::Good reproducible efforts.,It was hard to get consistent efforts and there is a question as to whether this reflects a maximal maneuver.,Poor effort, data can not be interpreted.} FVC: ***L FEV1: ***L, ***% predicted FEV1/FVC ratio: ***% Interpretation: {Blank single:19197::Spirometry consistent with mild obstructive disease,Spirometry consistent with moderate obstructive disease,Spirometry consistent with severe obstructive disease,Spirometry  consistent with possible restrictive disease,Spirometry consistent with mixed obstructive and restrictive disease,Spirometry uninterpretable due to technique,Spirometry consistent with normal pattern,No overt abnormalities noted given today's efforts}.  Please see scanned spirometry results for details.  Skin Testing: {Blank single:19197::Select foods,Environmental allergy panel,Environmental allergy panel and select foods,Food allergy panel,None,Deferred due to recent antihistamines use}. *** Results discussed with patient/family.   Past Medical History: Patient Active Problem List   Diagnosis Date Noted  . Multiple allergies 12/09/2023  . Social anxiety disorder 02/20/2021  . Non-seasonal allergic rhinitis 02/20/2021  . Constipation, chronic 07/13/2012  . Depression 09/21/2011   Past Medical History:  Diagnosis Date  . Allergy   . Anal fissure   . Anxiety   . Depression   . Hemorrhoids   . Unspecified constipation    Past Surgical History: Past Surgical History:  Procedure Laterality Date  . PERCUTANEOUS PINNING Left 05/30/2021   Procedure: Left hand Irrigation and Debridment, closed reduction percutaneous pinning;  Surgeon: Shari Easter, MD;  Location: [facility] OR;  Service: Orthopedics;  Laterality: Left;   Medication List:  Current Outpatient Medications  Medication Sig Dispense Refill  . fluticasone  (FLONASE ) 50 MCG/ACT nasal spray SHAKE LIQUID AND USE 2 SPRAYS IN EACH NOSTRIL DAILY AS DIRECTED 48 g 1   No current facility-administered medications for this visit.   Allergies: Allergies  Allergen Reactions  . Food Itching and Nausea And Vomiting    Fresh fruits and vegetables  . Lactose Intolerance (Gi) Nausea And Vomiting  . Pollen Extract Itching and Other (See Comments)    sneezing   Social History: Social History   Socioeconomic History  . Marital status: Married    Spouse name: Not on file  . Number of children: 1  . Years of education:  Not on file  . Highest education level: Bachelor's degree (e.g., BA, AB, BS)  Occupational History  . Occupation: Heritage manager  Tobacco Use  . Smoking status: Former    Current packs/day: 0.00    Types: Cigarettes    Quit date: 11/20/2006    Years since quitting: 17.2  . Smokeless tobacco: Never  Vaping Use  . Vaping status: Never Used  Substance and Sexual Activity  . Alcohol use: Not Currently  . Drug use: No  . Sexual activity: Yes  Other Topics Concern  . Not on file  Social History Narrative   Pt from [place].  Came to [place]  in 2001.       Social Drivers of Corporate investment banker Strain: Low Risk  (05/27/2023)   Overall Financial Resource Strain (CARDIA)   . Difficulty of Paying Living Expenses: Not very hard  Food Insecurity: No Food Insecurity (05/27/2023)   Hunger Vital Sign   . Worried About Programme researcher, broadcasting/film/video in the Last Year: Never true   . Ran Out of Food in the Last Year: Never true  Transportation Needs: No Transportation Needs (05/27/2023)   PRAPARE - Transportation   . Lack of Transportation (Medical): No   . Lack of Transportation (Non-Medical): No  Physical Activity: Unknown (05/27/2023)   Exercise Vital Sign   . Days of Exercise per Week: 0 days   . Minutes of Exercise per Session: Not on file  Stress: Stress Concern Present (05/27/2023)   [employer] of Occupational Health - Occupational Stress Questionnaire   . Feeling of Stress : To some extent  Social Connections: Moderately Isolated (05/27/2023)   Social Connection and Isolation Panel   . Frequency of Communication with Friends and Family: Twice a week   . Frequency of Social Gatherings with Friends and Family: More than three times a week   . Attends Religious Services: Never   . Active Member of Clubs or Organizations: No   . Attends Banker Meetings: Not on file   . Marital Status: Married   Lives in a ***. Smoking: *** Occupation: ***  Environmental HistoryArt gallery manager in the house: Network engineer in the family room: {Blank single:19197::yes,no} Carpet in the bedroom: {Blank single:19197::yes,no} Heating: {Blank single:19197::electric,gas,heat pump} Cooling: {Blank single:19197::central,window,heat pump} Pet: {Blank single:19197::yes ***,no}  Family History: Family History  Problem Relation Age of Onset  . Hypertension Mother   . Diabetes Brother   . Colon cancer Neg Hx   . Colon polyps Neg Hx    Problem                               Relation Asthma                                   *** Eczema                                *** Food allergy                          *** Allergic rhino conjunctivitis     ***  Review of Systems  Constitutional:  Negative for appetite change, chills, fever and unexpected weight change.  HENT:  Negative for congestion and rhinorrhea.   Eyes:  Negative for itching.  Respiratory:  Negative for cough, chest tightness, shortness of  breath and wheezing.   Cardiovascular:  Negative for chest pain.  Gastrointestinal:  Negative for abdominal pain.  Genitourinary:  Negative for difficulty urinating.  Skin:  Negative for rash.  Neurological:  Negative for headaches.    Objective: There were no vitals taken for this visit. There is no height or weight on file to calculate BMI. Physical Exam Vitals and nursing note reviewed.  Constitutional:      Appearance: Normal appearance. He is well-developed.  HENT:     Head: Normocephalic and atraumatic.     Right Ear: Tympanic membrane and external ear normal.     Left Ear: Tympanic membrane and external ear normal.     Nose: Nose normal.     Mouth/Throat:     Mouth: Mucous membranes are moist.     Pharynx: Oropharynx is clear.  Eyes:     Conjunctiva/sclera: Conjunctivae normal.  Cardiovascular:     Rate and Rhythm: Normal rate and regular rhythm.     Heart sounds: Normal heart sounds. No murmur heard.    No  friction rub. No gallop.  Pulmonary:     Effort: Pulmonary effort is normal.     Breath sounds: Normal breath sounds. No wheezing, rhonchi or rales.  Musculoskeletal:     Cervical back: Neck supple.  Skin:    General: Skin is warm.     Findings: No rash.  Neurological:     Mental Status: He is alert and oriented to person, place, and time.  Psychiatric:        Behavior: Behavior normal.   The plan was reviewed with the patient/family, and all questions/concerned were addressed.  It was my pleasure to see Tyrone Hendricks today and participate in his care. Please feel free to contact me with any questions or concerns.  Sincerely,  Orlan Cramp, DO Allergy & Immunology  [facility]  [place] office: [phone] [facility] office: [phone]

## 2024-02-23 ENCOUNTER — Encounter: Payer: Self-pay | Admitting: Allergy

## 2024-02-23 ENCOUNTER — Other Ambulatory Visit: Payer: Self-pay

## 2024-02-23 ENCOUNTER — Ambulatory Visit (INDEPENDENT_AMBULATORY_CARE_PROVIDER_SITE_OTHER): Admitting: Allergy

## 2024-02-23 VITALS — BP 112/72 | HR 66 | Temp 98.3°F | Resp 17 | Ht 66.93 in | Wt 174.2 lb

## 2024-02-23 DIAGNOSIS — J3089 Other allergic rhinitis: Secondary | ICD-10-CM | POA: Diagnosis not present

## 2024-02-23 DIAGNOSIS — T781XXD Other adverse food reactions, not elsewhere classified, subsequent encounter: Secondary | ICD-10-CM | POA: Diagnosis not present

## 2024-02-23 DIAGNOSIS — H1013 Acute atopic conjunctivitis, bilateral: Secondary | ICD-10-CM | POA: Diagnosis not present

## 2024-02-23 NOTE — Patient Instructions (Addendum)
 Rhinitis  Return for allergy skin testing. Will make additional recommendations based on results. If significant positives will recommend allergy injections - handout given. Make sure you don't take any antihistamines for 3 days before the skin testing appointment. Don't put any lotion on the back and arms on the day of testing.  Must be in good health and not ill. No vaccines/injections/antibiotics within the past 7 days.  Plan on being here for 30-60 minutes.  Use Flonase  (fluticasone ) nasal spray 1-2 sprays per nostril once a day as needed for nasal congestion.  Nasal saline spray (i.e., Simply Saline) or nasal saline lavage (i.e., NeilMed) is recommended as needed and prior to medicated nasal sprays.  Foods Avoid foods that are bothersome - fresh fruit and fresh vegetables. Will plan on select food testing at next visit.  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 This is caused by cross reactivity of pollen with fresh fruits and vegetables, and nuts. Symptoms are usually localized in the form of itching and burning in mouth and throat. Very rarely it can progress to more severe symptoms. Eating foods in cooked or processed forms usually minimizes symptoms. I recommended avoidance of eating the problem foods, especially during the peak season(s). Sometimes, OFAS can induce severe throat swelling or even a systemic reaction; with such instance, I advised them to report to a local ER. A list of common pollens and food cross-reactivities was provided to the patient.   Follow up for skin testing.

## 2024-03-29 ENCOUNTER — Ambulatory Visit (INDEPENDENT_AMBULATORY_CARE_PROVIDER_SITE_OTHER): Admitting: Allergy

## 2024-03-29 ENCOUNTER — Encounter: Payer: Self-pay | Admitting: Allergy

## 2024-03-29 DIAGNOSIS — J3089 Other allergic rhinitis: Secondary | ICD-10-CM | POA: Diagnosis not present

## 2024-03-29 DIAGNOSIS — T781XXD Other adverse food reactions, not elsewhere classified, subsequent encounter: Secondary | ICD-10-CM | POA: Diagnosis not present

## 2024-03-29 DIAGNOSIS — H1013 Acute atopic conjunctivitis, bilateral: Secondary | ICD-10-CM

## 2024-03-29 MED ORDER — MONTELUKAST SODIUM 10 MG PO TABS: 10.0000 mg | ORAL_TABLET | Freq: Every day | ORAL | 5 refills | Status: DC

## 2024-03-29 NOTE — Progress Notes (Signed)
 Skin testing note  RE: Tyrone Hendricks MRN: [id number] DOB: December 06, 1965 Date of Office Visit: 03/29/2024  Referring provider: [name], * Primary care provider: [name], MD  Chief Complaint: skin testing  History of Present Illness: I had the pleasure of seeing Tyrone Hendricks for a skin testing visit at the [facility] on 03/29/2024. He is a 58 y.o. male, who is being followed for allergic rhinoconjunctivitis and adverse food reaction. His previous allergy office visit was on 02/23/2024 with Dr. [name]. Today is a skin testing visit.   Discussed the use of AI scribe software for clinical note transcription with the patient, who gave verbal consent to proceed.    He has numerous allergies including grass, weed, ragweed, trees, mold, dust mites, cat, and a slight allergy to dogs. Spring is the worst season for his allergies, while fall and winter are less problematic. Issues with fresh fruits and vegetables persist year-round.  He has tried various allergy medications in the past. Zyrtec  and other medications were ineffective, but Flonase  has been helpful. He previously took Allegra but experienced breathing issues with it. He is currently not experiencing any breathing issues.  He works from 7:00 AM to 3:30 PM daily and lives about ten minutes away from the clinic.      Assessment and Plan: Trace is a 58 y.o. male with: Other allergic rhinitis Allergic conjunctivitis of both eyes Past history - Chronic allergic rhinitis with perennial symptoms, exacerbated in spring. OTC antihistamines ineffective.  Today's skin testing positive to grass, ragweed, weed, trees, mold, dust mites, cat. Borderline to dog.  Start environmental control measures as below. Use over the counter antihistamines such as Zyrtec  (cetirizine ), Claritin (loratadine), Allegra (fexofenadine), or Xyzal (levocetirizine) daily as needed. May take twice a day during allergy flares. May switch  antihistamines every few months. Recommend allergy injections. 2 shots Let us  know when ready to start.  Had a detailed discussion with patient/family that clinical history is suggestive of allergic rhinitis, and may benefit from allergy immunotherapy (AIT). Discussed in detail regarding the dosing, schedule, side effects (mild to moderate local allergic reaction and rarely systemic allergic reactions including anaphylaxis), and benefits (significant improvement in nasal symptoms, seasonal flares of asthma) of immunotherapy with the patient. There is significant time commitment involved with allergy shots, which includes weekly immunotherapy injections for first 9-12 months and then biweekly to monthly injections for 3-5 years.  Use Flonase  (fluticasone ) nasal spray 1-2 sprays per nostril once a day as needed for nasal congestion.  Nasal saline spray (i.e., Simply Saline) or nasal saline lavage (i.e., NeilMed) is recommended as needed and prior to medicated nasal sprays. Use over the counter antihistamines such as Zyrtec  (cetirizine ), Claritin (loratadine), or Xyzal (levocetirizine) daily as needed. May take twice a day during allergy flares. May switch antihistamines every few months. Start Singulair  (montelukast ) 10mg  daily at night. Cautioned that in some children/adults can experience behavioral changes including hyperactivity, agitation, depression, sleep disturbances and suicidal ideations. These side effects are rare, but if you notice them you should notify me and discontinue Singulair  (montelukast ).   Other adverse food reactions, not elsewhere classified, subsequent encounter Oral allergy syndrome, subsequent encounter Past history - Noticed perioral pruritus with fresh fruits and fresh vegetables. No issues in cooked/processed forms. Fresh apples also caused emesis in the past.  Today's skin testing borderline to cherry, cantaloupe. Negative to other select foods. Avoid foods that are  bothersome - fresh fruit and fresh vegetables. Discussed  that his food triggered oral and throat symptoms are likely caused by oral food allergy syndrome (OFAS). This is caused by cross reactivity of pollen with fresh fruits and vegetables, and nuts. Symptoms are usually localized in the form of itching and burning in mouth and throat. Very rarely it can progress to more severe symptoms. Eating foods in cooked or processed forms usually minimizes symptoms. I recommended avoidance of eating the problem foods, especially during the peak season(s). Sometimes, OFAS can induce severe throat swelling or even a systemic reaction; with such instance, I advised them to report to a local ER. A list of common pollens and food cross-reactivities was provided to the patient.     Return in about 7 months (around 10/27/2024).  Meds ordered this encounter  Medications   montelukast  (SINGULAIR ) 10 MG tablet    Sig: Take 1 tablet (10 mg total) by mouth at bedtime.    Dispense:  30 tablet    Refill:  5   Lab Orders  No laboratory test(s) ordered today    Diagnostics: Skin Testing: Environmental allergy panel and select foods. Today's skin testing positive to grass, ragweed, weed, trees, mold, dust mites, cat.  Borderline to dog.  Borderline to cherry, cantaloupe.  Results discussed with patient/family.  Airborne Adult Perc - 03/29/24 0953     Time Antigen Placed 9046    Allergen Manufacturer Jestine    Location Back    Number of Test 55    1. Control-Buffer 50% Glycerol Negative    2. Control-Histamine 2+    3. Bahia 2+    4. French Southern Territories Negative    5. Johnson --   +/-   6. [place]  Blue Negative    7. Meadow Fescue --   +/-   8. Perennial Rye --   +/-   9. Timothy Negative    10. Ragweed Mix 2+    11. Cocklebur 2+    12. Plantain,  English Negative    13. Baccharis Negative    14. Dog Fennel Negative    15. Russian Thistle 3+    16. Lamb's Quarters --   +/-   17. Sheep Sorrell --   +/-   18.  Rough Pigweed --   +/-   19. Marsh Elder, Rough --   +/-   20. Mugwort, Common 2+    21. Box, Elder 3+    22. Cedar, red 2+    23. Sweet Gum 3+    24. Pecan Pollen 4+    25. Pine Mix Negative    26. Walnut, Black Pollen --   +/-   27. Red Mulberry 2+    28. Ash Mix Negative    29. Birch Mix 2+    30. Beech American 3+    31. Cottonwood, Guinea[place]Bissau 2+    32. Hickory, White 3+    33. Maple Mix Negative    34. Oak, [place] Mix 3+    35. Sycamore Eastern Negative    38. Aspergillus Mix 2+    39. Penicillium Mix Negative    40. Bipolaris Sorokiniana (Helminthosporium) Negative    41. Drechslera Spicifera (Curvularia) 2+    42. Mucor Plumbeus Negative    43. Fusarium Moniliforme Negative    44. Aureobasidium Pullulans (pullulara) Negative    45. Rhizopus Oryzae Negative    46. Botrytis Cinera Negative    47. Epicoccum Nigrum Negative    48. Phoma Betae Negative    49. Dust Mite Mix Negative  50. Cat Hair 10,000 BAU/ml Negative    51.  Dog Epithelia Negative    52. Mixed Feathers Negative    53. Horse Epithelia Negative    54. Cockroach, German Negative    55. Tobacco Leaf Negative          13 Food Perc - 03/29/24 0956       Test Information   Time Antigen Placed --    Allergen Manufacturer --    Location --    Number of allergen test --    Food --          Intradermal - 03/29/24 0959     Time Antigen Placed 9040    Allergen Manufacturer Jestine    Location Arm    Number of Test 8    Control Negative    [place] Territ[place] 3+    Mold 2 2+    Mold 4 3+    Mite Mix 2+    Cat 2+    Dog --   +/-   Cockroach Negative          Food Adult Perc - 03/29/24 0900     Time Antigen Placed 9041    Allergen Manufacturer Jestine    Location Back    Number of allergen test 8    57. Banana Negative    58. Apple Negative    59. Peach Negative    60. Strawberry Negative    61. Blueberry Negative    62. Cherry --   +/-   63. Cantaloupe --   +/-   64. Watermelon Negative           Previous notes and tests were reviewed. The plan was reviewed with the patient/family, and all questions/concerned were addressed.  It was my pleasure to see Seth today and participate in his care. Please feel free to contact me with any questions or concerns.  Sincerely,  Orlan Cramp, DO Allergy & Immunology  [facility]  [facility] office: [phone] [facility] office: [phone]

## 2024-03-29 NOTE — Patient Instructions (Addendum)
 Today's skin testing positive to grass, ragweed, weed, trees, mold, dust mites, cat.  Borderline to dog.  Borderline to cherry, cantaloupe.   Results given.  Environmental allergies Start environmental control measures as below. Use over the counter antihistamines such as Zyrtec  (cetirizine ), Claritin (loratadine), Allegra (fexofenadine), or Xyzal (levocetirizine) daily as needed. May take twice a day during allergy flares. May switch antihistamines every few months. Recommend allergy injections. 2 shots Let us  know when ready to start.  Had a detailed discussion with patient/family that clinical history is suggestive of allergic rhinitis, and may benefit from allergy immunotherapy (AIT). Discussed in detail regarding the dosing, schedule, side effects (mild to moderate local allergic reaction and rarely systemic allergic reactions including anaphylaxis), and benefits (significant improvement in nasal symptoms, seasonal flares of asthma) of immunotherapy with the patient. There is significant time commitment involved with allergy shots, which includes weekly immunotherapy injections for first 9-12 months and then biweekly to monthly injections for 3-5 years.   Use Flonase  (fluticasone ) nasal spray 1-2 sprays per nostril once a day as needed for nasal congestion.  Nasal saline spray (i.e., Simply Saline) or nasal saline lavage (i.e., NeilMed) is recommended as needed and prior to medicated nasal sprays. Use over the counter antihistamines such as Zyrtec  (cetirizine ), Claritin (loratadine), or Xyzal (levocetirizine) daily as needed. May take twice a day during allergy flares. May switch antihistamines every few months. Start Singulair  (montelukast ) 10mg  daily at night. Cautioned that in some children/adults can experience behavioral changes including hyperactivity, agitation, depression, sleep disturbances and suicidal ideations. These side effects are rare, but if you notice them you should notify  me and discontinue Singulair  (montelukast ).  Foods Avoid foods that are bothersome - fresh fruit and fresh vegetables. 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 This is caused by cross reactivity of pollen with fresh fruits and vegetables, and nuts. Symptoms are usually localized in the form of itching and burning in mouth and throat. Very rarely it can progress to more severe symptoms. Eating foods in cooked or processed forms usually minimizes symptoms. I recommended avoidance of eating the problem foods, especially during the peak season(s). Sometimes, OFAS can induce severe throat swelling or even a systemic reaction; with such instance, I advised them to report to a local ER. A list of common pollens and food cross-reactivities was provided to the patient.   Follow up in 7 months or sooner if needed.   Reducing Pollen Exposure Pollen seasons: trees (spring), grass (summer) and ragweed/weeds (fall). Keep windows closed in your home and car to lower pollen exposure.  Install air conditioning in the bedroom and throughout the house if possible.  Avoid going out in dry windy days - especially early morning. Pollen counts are highest between 5 - 10 AM and on dry, hot and windy days.  Save outside activities for late afternoon or after a heavy rain, when pollen levels are lower.  Avoid mowing of grass if you have grass pollen allergy. Be aware that pollen can also be transported indoors on people and pets.  Dry your clothes in an automatic dryer rather than hanging them outside where they might collect pollen.  Rinse hair and eyes before bedtime. Mold Control Mold and fungi can grow on a variety of surfaces provided certain temperature and moisture conditions exist.  Outdoor molds grow on plants, decaying vegetation and soil. The major outdoor mold, Alternaria and Cladosporium, are found in very high numbers  during hot and  dry conditions. Generally, a late summer - fall peak is seen for common outdoor fungal spores. Rain will temporarily lower outdoor mold spore count, but counts rise rapidly when the rainy period ends. The most important indoor molds are Aspergillus and Penicillium. Dark, humid and poorly ventilated basements are ideal sites for mold growth. The next most common sites of mold growth are the bathroom and the kitchen. Outdoor (Seasonal) Mold Control Use air conditioning and keep windows closed. Avoid exposure to decaying vegetation. Avoid leaf raking. Avoid grain handling. Consider wearing a face mask if working in moldy areas.  Indoor (Perennial) Mold Control  Maintain humidity below 50%. Get rid of mold growth on hard surfaces with water, detergent and, if necessary, 5% bleach (do not mix with other cleaners). Then dry the area completely. If mold covers an area more than 10 square feet, consider hiring an indoor environmental professional. For clothing, washing with soap and water is best. If moldy items cannot be cleaned and dried, throw them away. Remove sources e.g. contaminated carpets. Repair and seal leaking roofs or pipes. Using dehumidifiers in damp basements may be helpful, but empty the water and clean units regularly to prevent mildew from forming. All rooms, especially basements, bathrooms and kitchens, require ventilation and cleaning to deter mold and mildew growth. Avoid carpeting on concrete or damp floors, and storing items in damp areas. Control of House Dust Mite Allergen Dust mite allergens are a common trigger of allergy and asthma symptoms. While they can be found throughout the house, these microscopic creatures thrive in warm, humid environments such as bedding, upholstered furniture and carpeting. Because so much time is spent in the bedroom, it is essential to reduce mite levels there.  Encase pillows, mattresses, and box springs in special allergen-proof fabric covers or  airtight, zippered plastic covers.  Bedding should be washed weekly in hot water (130 F) and dried in a hot dryer. Allergen-proof covers are available for comforters and pillows that can't be regularly washed.  Wash the allergy-proof covers every few months. Minimize clutter in the bedroom. Keep pets out of the bedroom.  Keep humidity less than 50% by using a dehumidifier or air conditioning. You can buy a humidity measuring device called a hygrometer to monitor this.  If possible, replace carpets with hardwood, linoleum, or washable area rugs. If that's not possible, vacuum frequently with a vacuum that has a HEPA filter. Remove all upholstered furniture and non-washable window drapes from the bedroom. Remove all non-washable stuffed toys from the bedroom.  Wash stuffed toys weekly. Pet Allergen Avoidance: Contrary to popular opinion, there are no "hypoallergenic" breeds of dogs or cats. That is because people are not allergic to an animal's hair, but to an allergen found in the animal's saliva, dander (dead skin flakes) or urine. Pet allergy symptoms typically occur within minutes. For some people, symptoms can build up and become most severe 8 to 12 hours after contact with the animal. People with severe allergies can experience reactions in public places if dander has been transported on the pet owners' clothing. Keeping an animal outdoors is only a partial solution, since homes with pets in the yard still have higher concentrations of animal allergens. Before getting a pet, ask your allergist to determine if you are allergic to animals. If your pet is already considered part of your family, try to minimize contact and keep the pet out of the bedroom and other rooms where you spend a great deal of  time. As with dust mites, vacuum carpets often or replace carpet with a hardwood floor, tile or linoleum. High-efficiency particulate air (HEPA) cleaners can reduce allergen levels over time. While dander  and saliva are the source of cat and dog allergens, urine is the source of allergens from rabbits, hamsters, mice and [place]rael pigs; so ask a non-allergic family member to clean the animal's cage. If you have a pet allergy, talk to your allergist about the potential for allergy immunotherapy (allergy shots). This strategy can often provide long-term relief.

## 2024-05-27 ENCOUNTER — Ambulatory Visit (INDEPENDENT_AMBULATORY_CARE_PROVIDER_SITE_OTHER): Admitting: Emergency Medicine

## 2024-05-27 VITALS — BP 112/68 | HR 68 | Temp 98.0°F | Ht 66.3 in | Wt 171.0 lb

## 2024-05-27 DIAGNOSIS — N529 Male erectile dysfunction, unspecified: Secondary | ICD-10-CM

## 2024-05-27 DIAGNOSIS — Z0000 Encounter for general adult medical examination without abnormal findings: Secondary | ICD-10-CM

## 2024-05-27 DIAGNOSIS — Z125 Encounter for screening for malignant neoplasm of prostate: Secondary | ICD-10-CM | POA: Diagnosis not present

## 2024-05-27 DIAGNOSIS — Z130 Encounter for screening for diseases of the blood and blood-forming organs and certain disorders involving the immune mechanism: Secondary | ICD-10-CM

## 2024-05-27 DIAGNOSIS — Z13228 Encounter for screening for other metabolic disorders: Secondary | ICD-10-CM

## 2024-05-27 DIAGNOSIS — Z13220 Encounter for screening for lipoid disorders: Secondary | ICD-10-CM | POA: Diagnosis not present

## 2024-05-27 DIAGNOSIS — Z1329 Encounter for screening for other suspected endocrine disorder: Secondary | ICD-10-CM | POA: Diagnosis not present

## 2024-05-27 LAB — COMPREHENSIVE METABOLIC PANEL WITH GFR
ALT: 14 U/L (ref 0–53)
AST: 16 U/L (ref 0–37)
Albumin: 4.6 g/dL (ref 3.5–5.2)
Alkaline Phosphatase: 57 U/L (ref 39–117)
BUN: 10 mg/dL (ref 6–23)
CO2: 26 meq/L (ref 19–32)
Calcium: 8.9 mg/dL (ref 8.4–10.5)
Chloride: 107 meq/L (ref 96–112)
Creatinine, Ser: 0.84 mg/dL (ref 0.40–1.50)
GFR: 97.39 mL/min (ref >60.00)
Glucose, Bld: 86 mg/dL (ref 70–99)
Potassium: 4.1 meq/L (ref 3.5–5.1)
Sodium: 139 meq/L (ref 135–145)
Total Bilirubin: 0.5 mg/dL (ref 0.2–1.2)
Total Protein: 6.6 g/dL (ref 6.0–8.3)

## 2024-05-27 LAB — CBC WITH DIFFERENTIAL/PLATELET
Basophils Absolute: 0 K/uL (ref 0.0–0.1)
Basophils Relative: 0.3 % (ref 0.0–3.0)
Eosinophils Absolute: 0.1 K/uL (ref 0.0–0.7)
Eosinophils Relative: 2.3 % (ref 0.0–5.0)
HCT: 42.5 % (ref 39.0–52.0)
Hemoglobin: 13.5 g/dL (ref 13.0–17.0)
Lymphocytes Relative: 35.1 % (ref 12.0–46.0)
Lymphs Abs: 1.4 K/uL (ref 0.7–4.0)
MCHC: 31.8 g/dL (ref 30.0–36.0)
MCV: 78 fl (ref 78.0–100.0)
Monocytes Absolute: 0.5 K/uL (ref 0.1–1.0)
Monocytes Relative: 12.5 % — ABNORMAL HIGH (ref 3.0–12.0)
Neutro Abs: 2 K/uL (ref 1.4–7.7)
Neutrophils Relative %: 49.8 % (ref 43.0–77.0)
Platelets: 178 K/uL (ref 150.0–400.0)
RBC: 5.45 Mil/uL (ref 4.22–5.81)
RDW: 14.3 % (ref 11.5–15.5)
WBC: 4.1 K/uL (ref 4.0–10.5)

## 2024-05-27 LAB — LIPID PANEL
Cholesterol: 170 mg/dL (ref 0–200)
HDL: 49.2 mg/dL (ref >39.00)
LDL Cholesterol: 107 mg/dL — ABNORMAL HIGH (ref 0–99)
NonHDL: 121.03
Total CHOL/HDL Ratio: 3
Triglycerides: 71 mg/dL (ref 0.0–149.0)
VLDL: 14.2 mg/dL (ref 0.0–40.0)

## 2024-05-27 LAB — HEMOGLOBIN A1C: Hgb A1c MFr Bld: 5.7 % (ref 4.6–6.5)

## 2024-05-27 LAB — PSA: PSA: 0.54 ng/mL (ref 0.10–4.00)

## 2024-05-27 MED ORDER — SILDENAFIL CITRATE 100 MG PO TABS
50.0000 mg | ORAL_TABLET | Freq: Every day | ORAL | 11 refills | Status: AC | PRN
Start: 2024-05-27 — End: ?

## 2024-05-27 NOTE — Patient Instructions (Signed)
 Health Maintenance, Male  Adopting a healthy lifestyle and getting preventive care are important in promoting health and wellness. Ask your health care provider about:  The right schedule for you to have regular tests and exams.  Things you can do on your own to prevent diseases and keep yourself healthy.  What should I know about diet, weight, and exercise?  Eat a healthy diet    Eat a diet that includes plenty of vegetables, fruits, low-fat dairy products, and lean protein.  Do not eat a lot of foods that are high in solid fats, added sugars, or sodium.  Maintain a healthy weight  Body mass index (BMI) is a measurement that can be used to identify possible weight problems. It estimates body fat based on height and weight. Your health care provider can help determine your BMI and help you achieve or maintain a healthy weight.  Get regular exercise  Get regular exercise. This is one of the most important things you can do for your health. Most adults should:  Exercise for at least 150 minutes each week. The exercise should increase your heart rate and make you sweat (moderate-intensity exercise).  Do strengthening exercises at least twice a week. This is in addition to the moderate-intensity exercise.  Spend less time sitting. Even light physical activity can be beneficial.  Watch cholesterol and blood lipids  Have your blood tested for lipids and cholesterol at 56 years of age, then have this test every 5 years.  You may need to have your cholesterol levels checked more often if:  Your lipid or cholesterol levels are high.  You are older than 56 years of age.  You are at high risk for heart disease.  What should I know about cancer screening?  Many types of cancers can be detected early and may often be prevented. Depending on your health history and family history, you may need to have cancer screening at various ages. This may include screening for:  Colorectal cancer.  Prostate cancer.  Skin cancer.  Lung  cancer.  What should I know about heart disease, diabetes, and high blood pressure?  Blood pressure and heart disease  High blood pressure causes heart disease and increases the risk of stroke. This is more likely to develop in people who have high blood pressure readings or are overweight.  Talk with your health care provider about your target blood pressure readings.  Have your blood pressure checked:  Every 3-5 years if you are 24-52 years of age.  Every year if you are 3 years old or older.  If you are between the ages of 60 and 72 and are a current or former smoker, ask your health care provider if you should have a one-time screening for abdominal aortic aneurysm (AAA).  Diabetes  Have regular diabetes screenings. This checks your fasting blood sugar level. Have the screening done:  Once every three years after age 66 if you are at a normal weight and have a low risk for diabetes.  More often and at a younger age if you are overweight or have a high risk for diabetes.  What should I know about preventing infection?  Hepatitis B  If you have a higher risk for hepatitis B, you should be screened for this virus. Talk with your health care provider to find out if you are at risk for hepatitis B infection.  Hepatitis C  Blood testing is recommended for:  Everyone born from [birth year] through 1963.  Anyone  with known risk factors for hepatitis C.  Sexually transmitted infections (STIs)  You should be screened each year for STIs, including gonorrhea and chlamydia, if:  You are sexually active and are younger than 56 years of age.  You are older than 56 years of age and your health care provider tells you that you are at risk for this type of infection.  Your sexual activity has changed since you were last screened, and you are at increased risk for chlamydia or gonorrhea. Ask your health care provider if you are at risk.  Ask your health care provider about whether you are at high risk for HIV. Your health care provider  may recommend a prescription medicine to help prevent HIV infection. If you choose to take medicine to prevent HIV, you should first get tested for HIV. You should then be tested every 3 months for as long as you are taking the medicine.  Follow these instructions at home:  Alcohol use  Do not drink alcohol if your health care provider tells you not to drink.  If you drink alcohol:  Limit how much you have to 0-2 drinks a day.  Know how much alcohol is in your drink. In the [place] one drink equals one 12 oz bottle of beer (355 mL), one 5 oz glass of wine (148 mL), or one 1 oz glass of hard liquor (44 mL).  Lifestyle  Do not use any products that contain nicotine or tobacco. These products include cigarettes, chewing tobacco, and vaping devices, such as e-cigarettes. If you need help quitting, ask your health care provider.  Do not use street drugs.  Do not share needles.  Ask your health care provider for help if you need support or information about quitting drugs.  General instructions  Schedule regular health, dental, and eye exams.  Stay current with your vaccines.  Tell your health care provider if:  You often feel depressed.  You have ever been abused or do not feel safe at home.  Summary  Adopting a healthy lifestyle and getting preventive care are important in promoting health and wellness.  Follow your health care provider's instructions about healthy diet, exercising, and getting tested or screened for diseases.  Follow your health care provider's instructions on monitoring your cholesterol and blood pressure.  This information is not intended to replace advice given to you by your health care provider. Make sure you discuss any questions you have with your health care provider.  Document Revised: 12/18/2020 Document Reviewed: 12/18/2020  [employer]vier Patient Education  2024 [employer].

## 2024-05-27 NOTE — Progress Notes (Signed)
 Tyrone Hendricks 58 y.o.   Chief Complaint  Patient presents with   Annual Exam    Patient here for physical. No other concerns.     HISTORY OF PRESENT ILLNESS: This is a 58 y.o. male here for annual exam Also complaining of erectile dysfunction Otherwise doing well.  No other complaints or medical concerns today.  HPI   Prior to Admission medications   Medication Sig Start Date End Date Taking? Authorizing Provider  fluticasone  (FLONASE ) 50 MCG/ACT nasal spray SHAKE LIQUID AND USE 2 SPRAYS IN EACH NOSTRIL DAILY AS DIRECTED 10/31/23  Yes Nijae Doyel, Emil Schanz, MD  montelukast  (SINGULAIR ) 10 MG tablet Take 1 tablet (10 mg total) by mouth at bedtime. 03/29/24   [name] Orlan HERO, DO  fluticasone  (FLONASE ) 50 MCG/ACT nasal spray USE 2 SPRAYS IN EACH NOSTRIL DAILY AS DIRECTED 12/13/19   Melonie Colonel, Mikel HERO, MD    Allergies  Allergen Reactions   Food Itching and Nausea And Vomiting    Fresh fruits and vegetables   Lactose Intolerance (Gi) Nausea And Vomiting   Pollen Extract Itching and Other (See Comments)    sneezing    Patient Active Problem List   Diagnosis Date Noted   Multiple allergies 12/09/2023   Social anxiety disorder 02/20/2021   Non-seasonal allergic rhinitis 02/20/2021   Constipation, chronic 07/13/2012   Depression 09/21/2011    Past Medical History:  Diagnosis Date   Allergy     Anal fissure    Anxiety    Depression    Hemorrhoids    Unspecified constipation     Past Surgical History:  Procedure Laterality Date   PERCUTANEOUS PINNING Left 05/30/2021   Procedure: Left hand Irrigation and Debridment, closed reduction percutaneous pinning;  Surgeon: Shari Easter, MD;  Location: [facility] OR;  Service: Orthopedics;  Laterality: Left;    Social History   Socioeconomic History   Marital status: Divorced    Spouse name: Not on file   Number of children: 1   Years of education: Not on file   Highest education level: Bachelor's degree (e.g., BA, AB, BS)  Occupational  History   Occupation: [employer]  Tobacco Use   Smoking status: Former    Current packs/day: 0.00    Types: Cigarettes    Quit date: 11/20/2006    Years since quitting: 17.5   Smokeless tobacco: Never  Vaping Use   Vaping status: Never Used  Substance and Sexual Activity   Alcohol use: Not Currently   Drug use: No   Sexual activity: Yes  Other Topics Concern   Not on file  Social History Narrative   Pt from [place].  Came to [place]  in 2001.       Social Drivers of Corporate investment banker Strain: Low Risk  (05/27/2024)   Overall Financial Resource Strain (CARDIA)    Difficulty of Paying Living Expenses: Not very hard  Food Insecurity: No Food Insecurity (05/27/2024)   Hunger Vital Sign    Worried About Running Out of Food in the Last Year: Never true    Ran Out of Food in the Last Year: Never true  Transportation Needs: No Transportation Needs (05/27/2024)   PRAPARE - Administrator, Civil Service (Medical): No    Lack of Transportation (Non-Medical): No  Physical Activity: Insufficiently Active (05/27/2024)   Exercise Vital Sign    Days of Exercise per Week: 5 days    Minutes of Exercise per Session: 10 min  Stress: Stress Concern Present (05/27/2024)  [employer] of Occupational Health - Occupational Stress Questionnaire    Feeling of Stress: To some extent  Social Connections: Moderately Integrated (05/27/2024)   Social Connection and Isolation Panel    Frequency of Communication with Friends and Family: More than three times a week    Frequency of Social Gatherings with Friends and Family: More than three times a week    Attends Religious Services: 1 to 4 times per year    Active Member of [employer] or Organizations: No    Attends Engineer, structural: Not on file    Marital Status: Married  Catering manager Violence: Not on file    Family History  Problem Relation Age of Onset   Hypertension Mother    Diabetes Brother    Colon cancer  Neg Hx    Colon polyps Neg Hx      Review of Systems  Constitutional: Negative.  Negative for chills and fever.  HENT: Negative.  Negative for congestion and sore throat.   Respiratory: Negative.  Negative for cough and shortness of breath.   Cardiovascular: Negative.  Negative for chest pain and palpitations.  Gastrointestinal:  Negative for abdominal pain, diarrhea, nausea and vomiting.  Genitourinary: Negative.  Negative for dysuria and hematuria.  Skin: Negative.  Negative for rash.  Neurological: Negative.  Negative for dizziness and headaches.  All other systems reviewed and are negative.   Vitals:   05/27/24 1456  BP: 112/68  Pulse: 68  Temp: 98 F (36.7 C)  SpO2: 98%    Physical Exam Vitals reviewed.  Constitutional:      Appearance: Normal appearance.  HENT:     Head: Normocephalic.     Right Ear: Tympanic membrane, ear canal and external ear normal.     Left Ear: Tympanic membrane, ear canal and external ear normal.     Mouth/Throat:     Mouth: Mucous membranes are moist.     Pharynx: Oropharynx is clear.  Eyes:     Extraocular Movements: Extraocular movements intact.     Conjunctiva/sclera: Conjunctivae normal.     Pupils: Pupils are equal, round, and reactive to light.  Cardiovascular:     Rate and Rhythm: Normal rate and regular rhythm.     Pulses: Normal pulses.     Heart sounds: Normal heart sounds.  Pulmonary:     Effort: Pulmonary effort is normal.     Breath sounds: Normal breath sounds.  Abdominal:     Palpations: Abdomen is soft.     Tenderness: There is no abdominal tenderness.  Musculoskeletal:     Cervical back: No tenderness.     Right lower leg: No edema.     Left lower leg: No edema.  Lymphadenopathy:     Cervical: No cervical adenopathy.  Skin:    General: Skin is warm and dry.     Capillary Refill: Capillary refill takes less than 2 seconds.  Neurological:     General: No focal deficit present.     Mental Status: He is alert  and oriented to person, place, and time.  Psychiatric:        Mood and Affect: Mood normal.        Behavior: Behavior normal.      ASSESSMENT & PLAN: Problem List Items Addressed This Visit   None Visit Diagnoses       Routine general medical examination at a health care facility    -  Primary   Relevant Orders   CBC with Differential/Platelet  Comprehensive metabolic panel with GFR   Hemoglobin A1c   Lipid panel     Erectile dysfunction, unspecified erectile dysfunction type       Relevant Medications   sildenafil (VIAGRA) 100 MG tablet     Screening for deficiency anemia       Relevant Orders   CBC with Differential/Platelet     Screening for lipoid disorders       Relevant Orders   Lipid panel     Screening for endocrine, metabolic and immunity disorder       Relevant Orders   Comprehensive metabolic panel with GFR   Hemoglobin A1c     Screening for prostate cancer       Relevant Orders   PSA      Modifiable risk factors discussed with patient. Anticipatory guidance according to age provided. The following topics were also discussed: Social Determinants of Health Smoking.  Non-smoker Diet and nutrition Benefits of exercise Cancer screening and review of most recent colonoscopy report from April 2025 Vaccinations review and recommendations Cardiovascular risk assessment and need for blood work The 10-year ASCVD risk score (Arnett DK, et al., 2019) is: 4.3%   Values used to calculate the score:     Age: 68 years     Clincally relevant sex: Male     Is Non-Hispanic African American: No     Diabetic: No     Tobacco smoker: No     Systolic Blood Pressure: 112 mmHg     Is BP treated: No     HDL Cholesterol: 46.6 mg/dL     Total Cholesterol: 171 mg/dL  Mental health including depression and anxiety Fall and accident prevention  Patient Instructions  Health Maintenance, Male Adopting a healthy lifestyle and getting preventive care are important in  promoting health and wellness. Ask your health care provider about: The right schedule for you to have regular tests and exams. Things you can do on your own to prevent diseases and keep yourself healthy. What should I know about diet, weight, and exercise? Eat a healthy diet  Eat a diet that includes plenty of vegetables, fruits, low-fat dairy products, and lean protein. Do not eat a lot of foods that are high in solid fats, added sugars, or sodium. Maintain a healthy weight Body mass index (BMI) is a measurement that can be used to identify possible weight problems. It estimates body fat based on height and weight. Your health care provider can help determine your BMI and help you achieve or maintain a healthy weight. Get regular exercise Get regular exercise. This is one of the most important things you can do for your health. Most adults should: Exercise for at least 150 minutes each week. The exercise should increase your heart rate and make you sweat (moderate-intensity exercise). Do strengthening exercises at least twice a week. This is in addition to the moderate-intensity exercise. Spend less time sitting. Even light physical activity can be beneficial. Watch cholesterol and blood lipids Have your blood tested for lipids and cholesterol at 56 years of age, then have this test every 5 years. You may need to have your cholesterol levels checked more often if: Your lipid or cholesterol levels are high. You are older than 56 years of age. You are at high risk for heart disease. What should I know about cancer screening? Many types of cancers can be detected early and may often be prevented. Depending on your health history and family history, you may need to have  cancer screening at various ages. This may include screening for: Colorectal cancer. Prostate cancer. Skin cancer. Lung cancer. What should I know about heart disease, diabetes, and high blood pressure? Blood pressure and  heart disease High blood pressure causes heart disease and increases the risk of stroke. This is more likely to develop in people who have high blood pressure readings or are overweight. Talk with your health care provider about your target blood pressure readings. Have your blood pressure checked: Every 3-5 years if you are 51-30 years of age. Every year if you are 15 years old or older. If you are between the ages of 32 and 53 and are a current or former smoker, ask your health care provider if you should have a one-time screening for abdominal aortic aneurysm (AAA). Diabetes Have regular diabetes screenings. This checks your fasting blood sugar level. Have the screening done: Once every three years after age 104 if you are at a normal weight and have a low risk for diabetes. More often and at a younger age if you are overweight or have a high risk for diabetes. What should I know about preventing infection? Hepatitis B If you have a higher risk for hepatitis B, you should be screened for this virus. Talk with your health care provider to find out if you are at risk for hepatitis B infection. Hepatitis C Blood testing is recommended for: Everyone born from [birth year] through 1963. Anyone with known risk factors for hepatitis C. Sexually transmitted infections (STIs) You should be screened each year for STIs, including gonorrhea and chlamydia, if: You are sexually active and are younger than 56 years of age. You are older than 56 years of age and your health care provider tells you that you are at risk for this type of infection. Your sexual activity has changed since you were last screened, and you are at increased risk for chlamydia or gonorrhea. Ask your health care provider if you are at risk. Ask your health care provider about whether you are at high risk for HIV. Your health care provider may recommend a prescription medicine to help prevent HIV infection. If you choose to take medicine to  prevent HIV, you should first get tested for HIV. You should then be tested every 3 months for as long as you are taking the medicine. Follow these instructions at home: Alcohol use Do not drink alcohol if your health care provider tells you not to drink. If you drink alcohol: Limit how much you have to 0-2 drinks a day. Know how much alcohol is in your drink. In the [place] one drink equals one 12 oz bottle of beer (355 mL), one 5 oz glass of wine (148 mL), or one 1 oz glass of hard liquor (44 mL). Lifestyle Do not use any products that contain nicotine or tobacco. These products include cigarettes, chewing tobacco, and vaping devices, such as e-cigarettes. If you need help quitting, ask your health care provider. Do not use street drugs. Do not share needles. Ask your health care provider for help if you need support or information about quitting drugs. General instructions Schedule regular health, dental, and eye exams. Stay current with your vaccines. Tell your health care provider if: You often feel depressed. You have ever been abused or do not feel safe at home. Summary Adopting a healthy lifestyle and getting preventive care are important in promoting health and wellness. Follow your health care provider's instructions about healthy diet, exercising, and getting tested or  screened for diseases. Follow your health care provider's instructions on monitoring your cholesterol and blood pressure. This information is not intended to replace advice given to you by your health care provider. Make sure you discuss any questions you have with your health care provider. Document Revised: 12/18/2020 Document Reviewed: 12/18/2020 Elsevier Patient Education  2024 [employer]     Emil Schaumann, MD [facility]

## 2024-05-28 ENCOUNTER — Ambulatory Visit: Payer: Self-pay | Admitting: Emergency Medicine
